# Patient Record
Sex: Female | Born: 1970
Health system: Southern US, Community
[De-identification: ages and names within clinical notes are randomized; demographics above are authoritative.]

## PROBLEM LIST (undated history)

## (undated) DIAGNOSIS — F329 Major depressive disorder, single episode, unspecified: Secondary | ICD-10-CM

## (undated) DIAGNOSIS — F32A Depression, unspecified: Secondary | ICD-10-CM

## (undated) DIAGNOSIS — Z516 Encounter for desensitization to allergens: Secondary | ICD-10-CM

## (undated) DIAGNOSIS — A6000 Herpesviral infection of urogenital system, unspecified: Secondary | ICD-10-CM

## (undated) DIAGNOSIS — R112 Nausea with vomiting, unspecified: Secondary | ICD-10-CM

## (undated) DIAGNOSIS — K219 Gastro-esophageal reflux disease without esophagitis: Secondary | ICD-10-CM

## (undated) DIAGNOSIS — Z9889 Other specified postprocedural states: Secondary | ICD-10-CM

## (undated) HISTORY — DX: Encounter for desensitization to allergens: Z51.6

## (undated) HISTORY — DX: Herpesviral infection of urogenital system, unspecified: A60.00

## (undated) HISTORY — DX: Gastro-esophageal reflux disease without esophagitis: K21.9

## (undated) HISTORY — DX: Major depressive disorder, single episode, unspecified: F32.9

## (undated) HISTORY — DX: Depression, unspecified: F32.A

---

## 2005-03-01 HISTORY — PX: ABDOMINAL HYSTERECTOMY: SHX81

## 2005-10-11 ENCOUNTER — Ambulatory Visit: Payer: Self-pay | Admitting: Obstetrics and Gynecology

## 2006-01-28 ENCOUNTER — Ambulatory Visit: Payer: Self-pay | Admitting: Obstetrics and Gynecology

## 2006-07-14 ENCOUNTER — Ambulatory Visit: Payer: Self-pay | Admitting: Obstetrics and Gynecology

## 2008-09-02 ENCOUNTER — Emergency Department: Payer: Self-pay | Admitting: Emergency Medicine

## 2009-12-31 ENCOUNTER — Ambulatory Visit: Payer: Self-pay

## 2011-02-03 ENCOUNTER — Ambulatory Visit: Payer: Self-pay

## 2014-01-11 ENCOUNTER — Ambulatory Visit: Payer: Self-pay | Admitting: Urgent Care

## 2014-01-18 ENCOUNTER — Ambulatory Visit: Payer: Self-pay | Admitting: Urgent Care

## 2014-07-25 ENCOUNTER — Encounter: Payer: Self-pay | Admitting: Family Medicine

## 2014-07-25 ENCOUNTER — Ambulatory Visit (INDEPENDENT_AMBULATORY_CARE_PROVIDER_SITE_OTHER): Payer: BLUE CROSS/BLUE SHIELD | Admitting: Family Medicine

## 2014-07-25 VITALS — BP 122/78 | Ht 65.0 in | Wt 160.0 lb

## 2014-07-25 DIAGNOSIS — F329 Major depressive disorder, single episode, unspecified: Secondary | ICD-10-CM

## 2014-07-25 DIAGNOSIS — K2100 Gastro-esophageal reflux disease with esophagitis, without bleeding: Secondary | ICD-10-CM

## 2014-07-25 DIAGNOSIS — F32A Depression, unspecified: Secondary | ICD-10-CM

## 2014-07-25 DIAGNOSIS — J328 Other chronic sinusitis: Secondary | ICD-10-CM | POA: Diagnosis not present

## 2014-07-25 DIAGNOSIS — J301 Allergic rhinitis due to pollen: Secondary | ICD-10-CM | POA: Diagnosis not present

## 2014-07-25 MED ORDER — DEXLANSOPRAZOLE 60 MG PO CPDR: 60.0000 mg | DELAYED_RELEASE_CAPSULE | Freq: Every day | ORAL | Status: DC

## 2014-07-25 NOTE — Progress Notes (Signed)
   Subjective:    Patient ID: Daisy Soto, female    DOB: [birth year], 44 y.o.   MRN: [id number]  HPI pt arrives today as a new pt. Pt's husband comes here Daisy Soto ( Bud).   Pt wants to discuss changing nexium back to dexilant. Upper endo revealed gastritis, pt was placed initially on nexium and then. GB scan was fine.  swiutched to dexilant and it helped, and pt not wanting to do an endoxcopy Would like a 90 day supply.   Pt got sick in November with sinus infection.  Has not been back to normal since. Feels like there is fluid in ears all the time.took two rounds of abx.  Saw an ent, cont to have popping sens in the ears.  Ongoing  Saw ENT.  Qnasal Prescribed prednisone and  spray. He wants to do allergy testing and a CT scan. Pt would like a second opinion.    rhinocort and nasocort taking. Checked on the price of the Qnasl. Very expensive. Patient under care from psychiatrist for depression and insomnia related to stress from divorce   Review of Systems No current abdominal pain no change in bowel habits no blood in stool    Objective:   Physical Exam  Alert no acute distress. Vitals stable. HEENT moderate nasal congestion. Left TM retracted. Pharynx normal neck supple no adenopathy lungs clear. Heart regular rhythm abdominal exam benign      Assessment & Plan:  Impression 1 reflux with history of gastritis #2 chronic sinus infection with middle ear effusion/pressure changes #3 depression with insomnia plan switch to dexilant per patient request. Patient to maintain taking care with psychiatrist. Long discussion held regarding ENT's plans. I think it is reasonable for them to do a CT to establish the level of sinus and middle ear involvement. Also go back sterilely nasal spray over-the-counter. 30 minutes spent most in discussion WSL

## 2014-07-26 ENCOUNTER — Other Ambulatory Visit: Payer: Self-pay | Admitting: Unknown Physician Specialty

## 2014-07-26 DIAGNOSIS — J329 Chronic sinusitis, unspecified: Secondary | ICD-10-CM

## 2014-07-30 ENCOUNTER — Other Ambulatory Visit: Payer: Self-pay | Admitting: Unknown Physician Specialty

## 2014-07-30 DIAGNOSIS — F32A Depression, unspecified: Secondary | ICD-10-CM | POA: Insufficient documentation

## 2014-07-30 DIAGNOSIS — F329 Major depressive disorder, single episode, unspecified: Secondary | ICD-10-CM | POA: Insufficient documentation

## 2014-07-30 DIAGNOSIS — J309 Allergic rhinitis, unspecified: Secondary | ICD-10-CM | POA: Insufficient documentation

## 2014-07-30 DIAGNOSIS — H903 Sensorineural hearing loss, bilateral: Secondary | ICD-10-CM

## 2014-07-30 DIAGNOSIS — K219 Gastro-esophageal reflux disease without esophagitis: Secondary | ICD-10-CM | POA: Insufficient documentation

## 2014-07-30 DIAGNOSIS — J329 Chronic sinusitis, unspecified: Secondary | ICD-10-CM | POA: Insufficient documentation

## 2014-08-01 ENCOUNTER — Ambulatory Visit: Admission: RE | Admit: 2014-08-01 | Payer: Self-pay | Source: Ambulatory Visit

## 2014-08-01 ENCOUNTER — Ambulatory Visit: Payer: BLUE CROSS/BLUE SHIELD

## 2014-12-02 ENCOUNTER — Encounter: Payer: Self-pay | Admitting: Family Medicine

## 2014-12-02 ENCOUNTER — Ambulatory Visit (INDEPENDENT_AMBULATORY_CARE_PROVIDER_SITE_OTHER): Payer: BLUE CROSS/BLUE SHIELD | Admitting: Family Medicine

## 2014-12-02 VITALS — BP 102/60 | Ht 65.0 in | Wt 165.0 lb

## 2014-12-02 DIAGNOSIS — L659 Nonscarring hair loss, unspecified: Secondary | ICD-10-CM | POA: Diagnosis not present

## 2014-12-02 DIAGNOSIS — R635 Abnormal weight gain: Secondary | ICD-10-CM

## 2014-12-02 DIAGNOSIS — R5383 Other fatigue: Secondary | ICD-10-CM | POA: Diagnosis not present

## 2014-12-02 DIAGNOSIS — Z23 Encounter for immunization: Secondary | ICD-10-CM | POA: Diagnosis not present

## 2014-12-02 NOTE — Progress Notes (Signed)
   Subjective:    Patient ID: Daisy Soto, female    DOB: 03-Dec-1970, 44 y.o.   MRN: [id number]  HPI Patient is here today because she has been experiencing hair loss, fatigue and weight gain.   Patient wants to have her thyroid levels checked to see if she has a thyroid issue. This has been going on since July 2016.   Patient has no other concerns at this time.   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  traz helpful, but question daytime drowsiness  No fam hx of thyr troule s            Review of Systems Some headache no GI symptoms no rash no GU symptoms no weight loss something    Objective:   Physical Exam  Alert mild malaise. Vital stable HEENT scalp slightly thin here no obvious alopecia pharynx normal neck supple. Lungs clear. Heart regular in rhythm.      Assessment & Plan:  Impression 1 fatigue discussed #2 excess hair loss #3 thyroid concerns. #4 increased stress both work and home #5 status post hysterectomy sometimes symptoms potentially consistent with low estrogenic plan appropriate blood work. Diet discussed exercise discussed. If low thyroid patient would like to see Dr. Francoise Schaumann easily 25 minutes spent most in discussion WSL

## 2014-12-05 LAB — LIPID PANEL
CHOL/HDL RATIO: 3.7 ratio (ref 0.0–4.4)
CHOLESTEROL TOTAL: 193 mg/dL (ref 100–199)
HDL: 52 mg/dL (ref >39)
LDL CALC: 100 mg/dL — AB (ref 0–99)
TRIGLYCERIDES: 205 mg/dL — AB (ref 0–149)
VLDL CHOLESTEROL CAL: 41 mg/dL — AB (ref 5–40)

## 2014-12-05 LAB — BASIC METABOLIC PANEL
BUN/Creatinine Ratio: 16 (ref 9–23)
BUN: 11 mg/dL (ref 6–24)
CALCIUM: 9.3 mg/dL (ref 8.7–10.2)
CO2: 25 mmol/L (ref 18–29)
Chloride: 101 mmol/L (ref 97–108)
Creatinine, Ser: 0.68 mg/dL (ref 0.57–1.00)
GFR calc Af Amer: 122 mL/min/{1.73_m2} (ref >59)
GFR calc non Af Amer: 106 mL/min/{1.73_m2} (ref >59)
GLUCOSE: 95 mg/dL (ref 65–99)
POTASSIUM: 4 mmol/L (ref 3.5–5.2)
SODIUM: 139 mmol/L (ref 134–144)

## 2014-12-05 LAB — FOLLICLE STIMULATING HORMONE: FSH: 2.2 m[IU]/mL

## 2014-12-05 LAB — HEPATIC FUNCTION PANEL
ALK PHOS: 80 IU/L (ref 39–117)
ALT: 12 IU/L (ref 0–32)
AST: 13 IU/L (ref 0–40)
Albumin: 4.1 g/dL (ref 3.5–5.5)
Bilirubin Total: 0.3 mg/dL (ref 0.0–1.2)
Bilirubin, Direct: 0.06 mg/dL (ref 0.00–0.40)
TOTAL PROTEIN: 6.6 g/dL (ref 6.0–8.5)

## 2014-12-05 LAB — CBC WITH DIFFERENTIAL/PLATELET
BASOS ABS: 0 10*3/uL (ref 0.0–0.2)
Basos: 0 %
EOS (ABSOLUTE): 0.1 10*3/uL (ref 0.0–0.4)
EOS: 1 %
HEMATOCRIT: 38.8 % (ref 34.0–46.6)
HEMOGLOBIN: 13.2 g/dL (ref 11.1–15.9)
IMMATURE GRANS (ABS): 0 10*3/uL (ref 0.0–0.1)
Immature Granulocytes: 0 %
LYMPHS ABS: 1 10*3/uL (ref 0.7–3.1)
LYMPHS: 22 %
MCH: 31.3 pg (ref 26.6–33.0)
MCHC: 34 g/dL (ref 31.5–35.7)
MCV: 92 fL (ref 79–97)
MONOCYTES: 8 %
Monocytes Absolute: 0.3 10*3/uL (ref 0.1–0.9)
NEUTROS ABS: 3.1 10*3/uL (ref 1.4–7.0)
Neutrophils: 69 %
Platelets: 209 10*3/uL (ref 150–379)
RBC: 4.22 x10E6/uL (ref 3.77–5.28)
RDW: 13.7 % (ref 12.3–15.4)
WBC: 4.5 10*3/uL (ref 3.4–10.8)

## 2014-12-05 LAB — TSH: TSH: 1.66 u[IU]/mL (ref 0.450–4.500)

## 2014-12-05 LAB — LUTEINIZING HORMONE: LH: 11.1 m[IU]/mL

## 2014-12-05 LAB — T4: T4 TOTAL: 6 ug/dL (ref 4.5–12.0)

## 2014-12-08 ENCOUNTER — Encounter: Payer: Self-pay | Admitting: Family Medicine

## 2014-12-16 ENCOUNTER — Telehealth: Payer: Self-pay | Admitting: Gastroenterology

## 2014-12-16 NOTE — Telephone Encounter (Signed)
Patient has called and would like to schedule an appointment with Dr Allen Norris. She states that she was last seen about a year ago and was prescribed dexilant. She states that she is having more severe flare ups and was advised that if it got worse that an EGD may need to be performed. Please contact patient at number confirmed in chart.

## 2014-12-16 NOTE — Telephone Encounter (Signed)
Please review, Possibly set her up for  Egd? without office appt?

## 2014-12-18 NOTE — Telephone Encounter (Signed)
LVM for pt to return my call to schedule EGD. Pt just needs a date and information.

## 2014-12-19 NOTE — Telephone Encounter (Signed)
Patient called and we have set her for her EGD

## 2014-12-25 NOTE — Telephone Encounter (Signed)
LVM for pt to return my call to see if she has decided on a date for her EGD.

## 2015-06-16 ENCOUNTER — Telehealth: Payer: Self-pay | Admitting: Gastroenterology

## 2015-06-16 NOTE — Telephone Encounter (Signed)
Daisy Soto, this is an established patient of [name]. She was last seen in 2015. She is again having problems with GERD and acid reflux - epigastric pain, severe burning - even with Dexilant that is being prescribed by her PCP.  I've made patient an appointment for May 16th at 3:30, if you are able to make a more urgent appointment please let her know. She was unable to do the 4:15 this Thursday 4/20 because of a deadline at work but said she could do any other day/time. Thanks!

## 2015-06-18 DIAGNOSIS — F4322 Adjustment disorder with anxiety: Secondary | ICD-10-CM | POA: Diagnosis not present

## 2015-06-18 NOTE — Telephone Encounter (Signed)
Called pt and advise her I have her on our wait list and will call if something opens up sooner.

## 2015-07-04 DIAGNOSIS — Z1231 Encounter for screening mammogram for malignant neoplasm of breast: Secondary | ICD-10-CM | POA: Diagnosis not present

## 2015-07-04 DIAGNOSIS — Z01419 Encounter for gynecological examination (general) (routine) without abnormal findings: Secondary | ICD-10-CM | POA: Diagnosis not present

## 2015-07-04 DIAGNOSIS — Z8742 Personal history of other diseases of the female genital tract: Secondary | ICD-10-CM | POA: Diagnosis not present

## 2015-07-04 DIAGNOSIS — Z1151 Encounter for screening for human papillomavirus (HPV): Secondary | ICD-10-CM | POA: Diagnosis not present

## 2015-07-04 DIAGNOSIS — A6004 Herpesviral vulvovaginitis: Secondary | ICD-10-CM | POA: Diagnosis not present

## 2015-07-04 DIAGNOSIS — Z124 Encounter for screening for malignant neoplasm of cervix: Secondary | ICD-10-CM | POA: Diagnosis not present

## 2015-07-08 DIAGNOSIS — F411 Generalized anxiety disorder: Secondary | ICD-10-CM | POA: Diagnosis not present

## 2015-07-08 DIAGNOSIS — F330 Major depressive disorder, recurrent, mild: Secondary | ICD-10-CM | POA: Diagnosis not present

## 2015-07-15 ENCOUNTER — Ambulatory Visit (INDEPENDENT_AMBULATORY_CARE_PROVIDER_SITE_OTHER): Payer: BLUE CROSS/BLUE SHIELD | Admitting: Gastroenterology

## 2015-07-15 ENCOUNTER — Encounter: Payer: Self-pay | Admitting: Gastroenterology

## 2015-07-15 ENCOUNTER — Other Ambulatory Visit: Payer: Self-pay

## 2015-07-15 VITALS — BP 99/57 | HR 87 | Temp 98.5°F | Ht 65.0 in | Wt 157.0 lb

## 2015-07-15 DIAGNOSIS — K219 Gastro-esophageal reflux disease without esophagitis: Secondary | ICD-10-CM | POA: Diagnosis not present

## 2015-07-15 MED ORDER — DEXLANSOPRAZOLE 60 MG PO CPDR: 60.0000 mg | DELAYED_RELEASE_CAPSULE | Freq: Every day | ORAL | Status: DC

## 2015-07-15 NOTE — Progress Notes (Signed)
   Primary Care Physician: Mickie Hillier, MD  Primary Gastroenterologist:  Dr. Lucilla Lame  Chief Complaint  Patient presents with  . Gastroesophageal Reflux    HPI: Daisy Soto is a 45 y.o. female here for follow-up of heartburn.The patient reports that last week she had breakthrough heartburn. The patient states that she had not stopped taking her Dexilant but she was under a lot more stress. The patient also reports that she had some bright red blood her rectum. She reports that this happens only when she has constipation. The patient has alternating diarrhea and constipation quite frequently. There is no report of any unexplained weight loss. The patient had a colonoscopy a few years ago.  Current Outpatient Prescriptions  Medication Sig Dispense Refill  . FLUoxetine (PROZAC) 40 MG capsule Take 40 mg by mouth daily.    . fluticasone (FLONASE) 50 MCG/ACT nasal spray     . traZODone (DESYREL) 50 MG tablet 50 mg. Takes one to two qhs prn  0  . acyclovir (ZOVIRAX) 400 MG tablet Take 400 mg by mouth 2 (two) times daily.  3  . dexlansoprazole (DEXILANT) 60 MG capsule Take 1 capsule (60 mg total) by mouth daily. 90 capsule 3   No current facility-administered medications for this visit.    Allergies as of 07/15/2015 - Review Complete 07/15/2015  Allergen Reaction Noted  . Biaxin [clarithromycin]  07/25/2014    ROS:  General: Negative for anorexia, weight loss, fever, chills, fatigue, weakness. ENT: Negative for hoarseness, difficulty swallowing , nasal congestion. CV: Negative for chest pain, angina, palpitations, dyspnea on exertion, peripheral edema.  Respiratory: Negative for dyspnea at rest, dyspnea on exertion, cough, sputum, wheezing.  GI: See history of present illness. GU:  Negative for dysuria, hematuria, urinary incontinence, urinary frequency, nocturnal urination.  Endo: Negative for unusual weight change.    Physical Examination:   BP 99/57 mmHg  Pulse 87   Temp(Src) 98.5 F (36.9 C) (Oral)  Ht 5\' 5"  (1.651 m)  Wt 157 lb (71.215 kg)  BMI 26.13 kg/m2  General: Well-nourished, well-developed in no acute distress.  Eyes: No icterus. Conjunctivae pink. Neuro: Alert and oriented x 3.  Grossly intact. Skin: Warm and dry, no jaundice.   Psych: Alert and cooperative, normal mood and affect.  Labs:    Imaging Studies: No results found.  Assessment and Plan:   Daisy Soto is a 45 y.o. y/o female who comes in today with a history of heartburn. The patient's heartburn has now resolved. The patient has been told to continue the Helper and to get some Tom's for whenever she has acid breakthrough. The patient has been explained the plan and agrees with it.   Note: This dictation was prepared with Dragon dictation along with smaller phrase technology. Any transcriptional errors that result from this process are unintentional.

## 2015-09-16 ENCOUNTER — Telehealth: Payer: Self-pay | Admitting: Gastroenterology

## 2015-09-16 ENCOUNTER — Other Ambulatory Visit: Payer: Self-pay

## 2015-09-16 DIAGNOSIS — K2100 Gastro-esophageal reflux disease with esophagitis, without bleeding: Secondary | ICD-10-CM

## 2015-09-16 MED ORDER — PANTOPRAZOLE SODIUM 40 MG PO TBEC: 40.0000 mg | DELAYED_RELEASE_TABLET | Freq: Every day | ORAL | Status: DC

## 2015-09-16 NOTE — Telephone Encounter (Signed)
Spoke with pt and she stated that she has been taking the Dexilant daily and has been adding Ranitidine in the afternoon for breakthrough reflux. She states she had taken Nexium in the past. Advised her we can change to Pantoprazole for 30 days. Is she doesn't have any improvement in her symptoms, then we should move forward with an EGD. Pt verbalized understanding and will call after the 30 days and advise.

## 2015-09-16 NOTE — Telephone Encounter (Signed)
Patient of Dr Allen Norris. She is on Dexilant, but it doesn't seem to be working anymore as she is having breakthrough flare ups with her GERD and acid reflux.  She would like to know if there is another medication that she could be switched to. Please call and advise.

## 2015-10-21 DIAGNOSIS — H10413 Chronic giant papillary conjunctivitis, bilateral: Secondary | ICD-10-CM | POA: Diagnosis not present

## 2015-10-29 DIAGNOSIS — F330 Major depressive disorder, recurrent, mild: Secondary | ICD-10-CM | POA: Diagnosis not present

## 2015-10-29 DIAGNOSIS — F411 Generalized anxiety disorder: Secondary | ICD-10-CM | POA: Diagnosis not present

## 2015-12-10 DIAGNOSIS — J309 Allergic rhinitis, unspecified: Secondary | ICD-10-CM | POA: Diagnosis not present

## 2015-12-10 DIAGNOSIS — J019 Acute sinusitis, unspecified: Secondary | ICD-10-CM | POA: Diagnosis not present

## 2016-01-14 DIAGNOSIS — Z23 Encounter for immunization: Secondary | ICD-10-CM | POA: Diagnosis not present

## 2016-03-02 DIAGNOSIS — F411 Generalized anxiety disorder: Secondary | ICD-10-CM | POA: Diagnosis not present

## 2016-03-02 DIAGNOSIS — F330 Major depressive disorder, recurrent, mild: Secondary | ICD-10-CM | POA: Diagnosis not present

## 2016-03-09 ENCOUNTER — Other Ambulatory Visit: Payer: Self-pay | Admitting: *Deleted

## 2016-03-09 ENCOUNTER — Telehealth: Payer: Self-pay | Admitting: Family Medicine

## 2016-03-09 DIAGNOSIS — K2100 Gastro-esophageal reflux disease with esophagitis, without bleeding: Secondary | ICD-10-CM

## 2016-03-09 NOTE — Telephone Encounter (Signed)
Would you like to refer or see pt?

## 2016-03-09 NOTE — Telephone Encounter (Signed)
Referral put in. Pt notified.  

## 2016-03-09 NOTE — Telephone Encounter (Signed)
Patient is wanting to start seeing a new gastro doctor, Dr. Watt Climes at [facility] in [place] for gastritis.  Patient has been seeing Dr. Roselyn Reef in [place] but is unhappy with her treatment because the medication isn't helping.  She wants to know if we can set her up with a referral or does she need to schedule an appointment with our office first?

## 2016-03-09 NOTE — Telephone Encounter (Signed)
Can do referal

## 2016-03-12 DIAGNOSIS — J301 Allergic rhinitis due to pollen: Secondary | ICD-10-CM | POA: Diagnosis not present

## 2016-03-15 DIAGNOSIS — J301 Allergic rhinitis due to pollen: Secondary | ICD-10-CM | POA: Diagnosis not present

## 2016-03-22 DIAGNOSIS — J301 Allergic rhinitis due to pollen: Secondary | ICD-10-CM | POA: Diagnosis not present

## 2016-03-25 DIAGNOSIS — J301 Allergic rhinitis due to pollen: Secondary | ICD-10-CM | POA: Diagnosis not present

## 2016-03-29 DIAGNOSIS — J301 Allergic rhinitis due to pollen: Secondary | ICD-10-CM | POA: Diagnosis not present

## 2016-03-31 DIAGNOSIS — J301 Allergic rhinitis due to pollen: Secondary | ICD-10-CM | POA: Diagnosis not present

## 2016-04-01 DIAGNOSIS — J301 Allergic rhinitis due to pollen: Secondary | ICD-10-CM | POA: Diagnosis not present

## 2016-04-05 DIAGNOSIS — J301 Allergic rhinitis due to pollen: Secondary | ICD-10-CM | POA: Diagnosis not present

## 2016-04-08 DIAGNOSIS — J301 Allergic rhinitis due to pollen: Secondary | ICD-10-CM | POA: Diagnosis not present

## 2016-04-12 DIAGNOSIS — J301 Allergic rhinitis due to pollen: Secondary | ICD-10-CM | POA: Diagnosis not present

## 2016-04-15 DIAGNOSIS — J301 Allergic rhinitis due to pollen: Secondary | ICD-10-CM | POA: Diagnosis not present

## 2016-04-19 ENCOUNTER — Other Ambulatory Visit (HOSPITAL_COMMUNITY): Payer: Self-pay | Admitting: Gastroenterology

## 2016-04-19 DIAGNOSIS — R1084 Generalized abdominal pain: Secondary | ICD-10-CM

## 2016-04-19 DIAGNOSIS — K2100 Gastro-esophageal reflux disease with esophagitis, without bleeding: Secondary | ICD-10-CM

## 2016-04-19 DIAGNOSIS — R1013 Epigastric pain: Secondary | ICD-10-CM | POA: Diagnosis not present

## 2016-04-22 DIAGNOSIS — J301 Allergic rhinitis due to pollen: Secondary | ICD-10-CM | POA: Diagnosis not present

## 2016-04-26 ENCOUNTER — Encounter (HOSPITAL_COMMUNITY): Payer: BLUE CROSS/BLUE SHIELD

## 2016-04-29 DIAGNOSIS — J301 Allergic rhinitis due to pollen: Secondary | ICD-10-CM | POA: Diagnosis not present

## 2016-05-06 DIAGNOSIS — J301 Allergic rhinitis due to pollen: Secondary | ICD-10-CM | POA: Diagnosis not present

## 2016-05-13 DIAGNOSIS — J301 Allergic rhinitis due to pollen: Secondary | ICD-10-CM | POA: Diagnosis not present

## 2016-05-20 DIAGNOSIS — J301 Allergic rhinitis due to pollen: Secondary | ICD-10-CM | POA: Diagnosis not present

## 2016-05-24 DIAGNOSIS — H10413 Chronic giant papillary conjunctivitis, bilateral: Secondary | ICD-10-CM | POA: Diagnosis not present

## 2016-05-26 DIAGNOSIS — J301 Allergic rhinitis due to pollen: Secondary | ICD-10-CM | POA: Diagnosis not present

## 2016-05-26 DIAGNOSIS — J0140 Acute pansinusitis, unspecified: Secondary | ICD-10-CM | POA: Diagnosis not present

## 2016-06-01 DIAGNOSIS — F330 Major depressive disorder, recurrent, mild: Secondary | ICD-10-CM | POA: Diagnosis not present

## 2016-06-01 DIAGNOSIS — F411 Generalized anxiety disorder: Secondary | ICD-10-CM | POA: Diagnosis not present

## 2016-06-03 DIAGNOSIS — J301 Allergic rhinitis due to pollen: Secondary | ICD-10-CM | POA: Diagnosis not present

## 2016-06-10 DIAGNOSIS — J301 Allergic rhinitis due to pollen: Secondary | ICD-10-CM | POA: Diagnosis not present

## 2016-06-17 DIAGNOSIS — J301 Allergic rhinitis due to pollen: Secondary | ICD-10-CM | POA: Diagnosis not present

## 2016-06-22 DIAGNOSIS — J301 Allergic rhinitis due to pollen: Secondary | ICD-10-CM | POA: Diagnosis not present

## 2016-06-24 DIAGNOSIS — J301 Allergic rhinitis due to pollen: Secondary | ICD-10-CM | POA: Diagnosis not present

## 2016-07-01 DIAGNOSIS — J301 Allergic rhinitis due to pollen: Secondary | ICD-10-CM | POA: Diagnosis not present

## 2016-07-01 DIAGNOSIS — F330 Major depressive disorder, recurrent, mild: Secondary | ICD-10-CM | POA: Diagnosis not present

## 2016-07-01 DIAGNOSIS — F411 Generalized anxiety disorder: Secondary | ICD-10-CM | POA: Diagnosis not present

## 2016-07-15 DIAGNOSIS — J301 Allergic rhinitis due to pollen: Secondary | ICD-10-CM | POA: Diagnosis not present

## 2016-07-28 DIAGNOSIS — R140 Abdominal distension (gaseous): Secondary | ICD-10-CM | POA: Diagnosis not present

## 2016-07-29 DIAGNOSIS — J301 Allergic rhinitis due to pollen: Secondary | ICD-10-CM | POA: Diagnosis not present

## 2016-08-03 DIAGNOSIS — F4320 Adjustment disorder, unspecified: Secondary | ICD-10-CM | POA: Diagnosis not present

## 2016-08-05 ENCOUNTER — Ambulatory Visit (INDEPENDENT_AMBULATORY_CARE_PROVIDER_SITE_OTHER): Payer: BLUE CROSS/BLUE SHIELD | Admitting: Obstetrics and Gynecology

## 2016-08-05 ENCOUNTER — Encounter: Payer: Self-pay | Admitting: Obstetrics and Gynecology

## 2016-08-05 VITALS — BP 118/76 | Ht 65.0 in | Wt 170.0 lb

## 2016-08-05 DIAGNOSIS — Z1239 Encounter for other screening for malignant neoplasm of breast: Secondary | ICD-10-CM

## 2016-08-05 DIAGNOSIS — Z1151 Encounter for screening for human papillomavirus (HPV): Secondary | ICD-10-CM | POA: Diagnosis not present

## 2016-08-05 DIAGNOSIS — Z1231 Encounter for screening mammogram for malignant neoplasm of breast: Secondary | ICD-10-CM

## 2016-08-05 DIAGNOSIS — Z01419 Encounter for gynecological examination (general) (routine) without abnormal findings: Secondary | ICD-10-CM | POA: Diagnosis not present

## 2016-08-05 DIAGNOSIS — Z124 Encounter for screening for malignant neoplasm of cervix: Secondary | ICD-10-CM | POA: Diagnosis not present

## 2016-08-05 DIAGNOSIS — Z713 Dietary counseling and surveillance: Secondary | ICD-10-CM | POA: Diagnosis not present

## 2016-08-05 DIAGNOSIS — Z8041 Family history of malignant neoplasm of ovary: Secondary | ICD-10-CM | POA: Diagnosis not present

## 2016-08-05 DIAGNOSIS — R87620 Atypical squamous cells of undetermined significance on cytologic smear of vagina (ASC-US): Secondary | ICD-10-CM | POA: Diagnosis not present

## 2016-08-05 DIAGNOSIS — J301 Allergic rhinitis due to pollen: Secondary | ICD-10-CM | POA: Diagnosis not present

## 2016-08-05 DIAGNOSIS — A6004 Herpesviral vulvovaginitis: Secondary | ICD-10-CM

## 2016-08-05 NOTE — Progress Notes (Signed)
Chief Complaint  Patient presents with  . Annual Exam     HPI:      [name] is a 46 y.o. No obstetric history on file. who LMP was No LMP recorded. Patient has had a hysterectomy., presents today for her annual examination.  Her menses are absent due to TAH. She does not have intermenstrual bleeding.  Sex activity: single partner, contraception - post menopausal status.  Last Pap: December 15, 2012  Results were: no abnormalities /neg HPV DNA  Hx of STDs: HSV, HPV. She takes acyclovir prn and will call for Rx RF prn.  Last mammogram: Jul 04, 2015  Results were: normal--routine follow-up in 12 months There is no FH of breast cancer. There is a FH of ovarian cancer in her MGM, genetic testing not done. Pt declined last yr.  The patient does do self-breast exams.  Tobacco use: The patient denies current or previous tobacco use. Alcohol use: social drinker Exercise: moderately active  She does get adequate calcium and Vitamin D in her diet.  She has not been able to lose wt, but is eating healthier and walking for exercise. She had normal TSH with GI recently. She is frustrated. She is up 16# from last yr on our scales. She has been under increased stressed with a child.   Past Medical History:  Diagnosis Date  . Depression   . GERD (gastroesophageal reflux disease)   . Herpes genitalia     Past Surgical History:  Procedure Laterality Date  . ABDOMINAL HYSTERECTOMY  2007  . CESAREAN SECTION  4- 2000    Family History  Problem Relation Age of Onset  . Stroke Mother   . Hypertension Mother   . Heart disease Father   . Hypertension Father   . Ovarian cancer Maternal Grandmother 80    Social History   Social History  . Marital status: Married    Spouse name: N/A  . Number of children: N/A  . Years of education: N/A   Occupational History  . Not on file.   Social History Main Topics  . Smoking status: Never Smoker  . Smokeless tobacco: Never Used  .  Alcohol use 0.0 oz/week     Comment: rare consumption  . Drug use: No  . Sexual activity: Yes    Birth control/ protection: Surgical   Other Topics Concern  . Not on file   Social History Narrative  . No narrative on file     Current Outpatient Prescriptions:  .  acyclovir (ZOVIRAX) 400 MG tablet, Take 400 mg by mouth 2 (two) times daily., Disp: , Rfl: 3 .  FLUoxetine (PROZAC) 40 MG capsule, Take 40 mg by mouth daily., Disp: , Rfl:  .  fluticasone (FLONASE) 50 MCG/ACT nasal spray, , Disp: , Rfl:  .  dexlansoprazole (DEXILANT) 60 MG capsule, Take 1 capsule (60 mg total) by mouth daily. (Patient not taking: Reported on 08/05/2016), Disp: 90 capsule, Rfl: 3 .  pantoprazole (PROTONIX) 40 MG tablet, Take 1 tablet (40 mg total) by mouth daily. (Patient not taking: Reported on 08/05/2016), Disp: 30 tablet, Rfl: 1 .  traZODone (DESYREL) 50 MG tablet, 50 mg. Takes one to two qhs prn, Disp: , Rfl: 0  ROS:  Review of Systems  Constitutional: Negative for fatigue, fever and unexpected weight change.  Respiratory: Negative for cough, shortness of breath and wheezing.   Cardiovascular: Negative for chest pain, palpitations and leg swelling.  Gastrointestinal: Negative for blood in stool,  constipation, diarrhea, nausea and vomiting.  Endocrine: Negative for cold intolerance, heat intolerance and polyuria.  Genitourinary: Negative for dyspareunia, dysuria, flank pain, frequency, genital sores, hematuria, menstrual problem, pelvic pain, urgency, vaginal bleeding, vaginal discharge and vaginal pain.  Musculoskeletal: Negative for back pain, joint swelling and myalgias.  Skin: Negative for rash.  Neurological: Negative for dizziness, syncope, light-headedness, numbness and headaches.  Hematological: Negative for adenopathy.  Psychiatric/Behavioral: Negative for agitation, confusion, sleep disturbance and suicidal ideas. The patient is not nervous/anxious.      Objective: BP 118/76   Ht 5\' 5"  (1.651  m)   Wt 170 lb (77.1 kg)   BMI 28.29 kg/m    Physical Exam  Constitutional: She is oriented to person, place, and time. She appears well-developed and well-nourished.  Genitourinary: Vagina normal. There is no rash or tenderness on the right labia. There is no rash or tenderness on the left labia. No erythema or tenderness in the vagina. No vaginal discharge found. Right adnexum does not display mass and does not display tenderness. Left adnexum does not display mass and does not display tenderness.  Genitourinary Comments: UTERUS/CX SURG REM  Neck: Normal range of motion. No thyromegaly present.  Cardiovascular: Normal rate, regular rhythm and normal heart sounds.   No murmur heard. Pulmonary/Chest: Effort normal and breath sounds normal. Right breast exhibits no mass, no nipple discharge, no skin change and no tenderness. Left breast exhibits no mass, no nipple discharge, no skin change and no tenderness.  Abdominal: Soft. There is no tenderness. There is no guarding.  Musculoskeletal: Normal range of motion.  Neurological: She is alert and oriented to person, place, and time. No cranial nerve deficit.  Psychiatric: She has a normal mood and affect. Her behavior is normal.  Vitals reviewed.   Assessment/Plan: Encounter for annual routine gynecological examination  Cervical cancer screening - Plan: IGP, Aptima HPV  Screening for HPV (human papillomavirus) - Plan: IGP, Aptima HPV  Screening for breast cancer - Pt to sched mammo. - Plan: MM DIGITAL SCREENING BILATERAL  Weight loss counseling, encounter for - [employer]/<40 g carbs/increase exercise intensity/stress mgmt. F/u prn.  Herpes simplex vulvovaginitis - Will call for acyclovir RF prn.  Family history of ovarian cancer - My Risk testing offered again and pt declines. No further screening recommendations since MGM.             GYN counsel mammography screening, adequate intake of calcium and vitamin D, diet and  exercise     F/U  Return in about 1 year (around 08/05/2017).  Takeisha Cianci B. Chenay Nesmith, PA-C 08/05/2016 3:42 PM

## 2016-08-10 LAB — IGP, APTIMA HPV
HPV Aptima: NEGATIVE
PAP SMEAR COMMENT: 0

## 2016-08-12 ENCOUNTER — Encounter: Payer: Self-pay | Admitting: Family Medicine

## 2016-08-12 ENCOUNTER — Ambulatory Visit (INDEPENDENT_AMBULATORY_CARE_PROVIDER_SITE_OTHER): Payer: BLUE CROSS/BLUE SHIELD | Admitting: Family Medicine

## 2016-08-12 VITALS — BP 112/78 | Temp 98.6°F | Ht 65.0 in | Wt 167.0 lb

## 2016-08-12 DIAGNOSIS — J209 Acute bronchitis, unspecified: Secondary | ICD-10-CM

## 2016-08-12 DIAGNOSIS — J329 Chronic sinusitis, unspecified: Secondary | ICD-10-CM | POA: Diagnosis not present

## 2016-08-12 DIAGNOSIS — J310 Chronic rhinitis: Secondary | ICD-10-CM

## 2016-08-12 MED ORDER — CEFPROZIL 500 MG PO TABS: 500.0000 mg | ORAL_TABLET | Freq: Two times a day (BID) | ORAL | 0 refills | Status: DC

## 2016-08-12 NOTE — Progress Notes (Signed)
   Subjective:    Patient ID: Daisy Soto, female    DOB: 11-May-1970, 46 y.o.   MRN: [id number]  Sinusitis  This is a new problem. Episode onset: 3 days. Associated symptoms include congestion, coughing and a sore throat. (Fever) Treatments tried: mucinex.    mon sore throat  Little cong  Then tue sore throt and feeling pooly  Felt  burnign into the chest  isusiing mucinex  No knon expoure  Pos prod cough no phlegm'   Had hx of bonchitis bad two yrs ago  Do not smoke   No wheezing hx  Allergies to dogs, on allergy shots,  Once per wk vaccines    Review of Systems  HENT: Positive for congestion and sore throat.   Respiratory: Positive for cough.        Objective:   Physical Exam Alert, mild malaise. Hydration good Vitals stable. frontal/ maxillary tenderness evident positive nasal congestion. pharynx normal neck supple  lungs clear/no crackles or wheezes. heart regular in rhythm        Assessment & Plan:  Impression rhinosinusitis likely post viral, discussed with patient. plan antibiotics prescribed. Questions answered. Symptomatic care discussed. warning signs discussed. WSL Plus element of bronchitis and laryngitis discussed

## 2016-08-18 DIAGNOSIS — J301 Allergic rhinitis due to pollen: Secondary | ICD-10-CM | POA: Diagnosis not present

## 2016-08-24 DIAGNOSIS — F4320 Adjustment disorder, unspecified: Secondary | ICD-10-CM | POA: Diagnosis not present

## 2016-08-25 DIAGNOSIS — J301 Allergic rhinitis due to pollen: Secondary | ICD-10-CM | POA: Diagnosis not present

## 2016-09-02 DIAGNOSIS — J301 Allergic rhinitis due to pollen: Secondary | ICD-10-CM | POA: Diagnosis not present

## 2016-09-03 DIAGNOSIS — J301 Allergic rhinitis due to pollen: Secondary | ICD-10-CM | POA: Diagnosis not present

## 2016-09-03 DIAGNOSIS — J342 Deviated nasal septum: Secondary | ICD-10-CM | POA: Diagnosis not present

## 2016-09-03 DIAGNOSIS — J329 Chronic sinusitis, unspecified: Secondary | ICD-10-CM | POA: Diagnosis not present

## 2016-09-08 ENCOUNTER — Encounter: Payer: Self-pay | Admitting: Family Medicine

## 2016-09-08 ENCOUNTER — Ambulatory Visit (INDEPENDENT_AMBULATORY_CARE_PROVIDER_SITE_OTHER): Payer: BLUE CROSS/BLUE SHIELD | Admitting: Family Medicine

## 2016-09-08 NOTE — Progress Notes (Unsigned)
   Subjective:    Patient ID: Daisy Soto, female    DOB: 1970-12-23, 46 y.o.   MRN: [id number]  HPI  Patient in today with c/o fatigue. Onset months ago.   States no other concerns this visit.  Review of Systems     Objective:   Physical Exam        Assessment & Plan:

## 2016-09-09 DIAGNOSIS — J301 Allergic rhinitis due to pollen: Secondary | ICD-10-CM | POA: Diagnosis not present

## 2016-09-14 DIAGNOSIS — J301 Allergic rhinitis due to pollen: Secondary | ICD-10-CM | POA: Diagnosis not present

## 2016-09-16 DIAGNOSIS — J301 Allergic rhinitis due to pollen: Secondary | ICD-10-CM | POA: Diagnosis not present

## 2016-09-17 DIAGNOSIS — J329 Chronic sinusitis, unspecified: Secondary | ICD-10-CM | POA: Diagnosis not present

## 2016-09-21 DIAGNOSIS — J329 Chronic sinusitis, unspecified: Secondary | ICD-10-CM | POA: Diagnosis not present

## 2016-09-21 DIAGNOSIS — J301 Allergic rhinitis due to pollen: Secondary | ICD-10-CM | POA: Diagnosis not present

## 2016-09-23 DIAGNOSIS — J301 Allergic rhinitis due to pollen: Secondary | ICD-10-CM | POA: Diagnosis not present

## 2016-09-29 DIAGNOSIS — J301 Allergic rhinitis due to pollen: Secondary | ICD-10-CM | POA: Diagnosis not present

## 2016-10-01 DIAGNOSIS — F330 Major depressive disorder, recurrent, mild: Secondary | ICD-10-CM | POA: Diagnosis not present

## 2016-10-01 DIAGNOSIS — F411 Generalized anxiety disorder: Secondary | ICD-10-CM | POA: Diagnosis not present

## 2016-10-11 DIAGNOSIS — F4320 Adjustment disorder, unspecified: Secondary | ICD-10-CM | POA: Diagnosis not present

## 2016-10-21 ENCOUNTER — Encounter
Admission: RE | Disposition: A | Discharge status: Home / Self Care | Payer: Self-pay | Source: Ambulatory Visit | Attending: Otolaryngology

## 2016-10-21 ENCOUNTER — Ambulatory Visit
Admission: RE | Admit: 2016-10-21 | Discharge: 2016-10-21 | Disposition: A | Discharge status: Home / Self Care | Payer: BLUE CROSS/BLUE SHIELD | Source: Ambulatory Visit | Attending: Otolaryngology | Admitting: Otolaryngology

## 2016-10-21 ENCOUNTER — Ambulatory Visit: Payer: BLUE CROSS/BLUE SHIELD | Admitting: Anesthesiology

## 2016-10-21 DIAGNOSIS — Z79899 Other long term (current) drug therapy: Secondary | ICD-10-CM | POA: Diagnosis not present

## 2016-10-21 DIAGNOSIS — J320 Chronic maxillary sinusitis: Secondary | ICD-10-CM | POA: Diagnosis not present

## 2016-10-21 DIAGNOSIS — D106 Benign neoplasm of nasopharynx: Secondary | ICD-10-CM | POA: Diagnosis not present

## 2016-10-21 DIAGNOSIS — D169 Benign neoplasm of bone and articular cartilage, unspecified: Secondary | ICD-10-CM | POA: Insufficient documentation

## 2016-10-21 DIAGNOSIS — J322 Chronic ethmoidal sinusitis: Secondary | ICD-10-CM | POA: Diagnosis not present

## 2016-10-21 DIAGNOSIS — J329 Chronic sinusitis, unspecified: Secondary | ICD-10-CM | POA: Diagnosis not present

## 2016-10-21 DIAGNOSIS — J321 Chronic frontal sinusitis: Secondary | ICD-10-CM | POA: Diagnosis not present

## 2016-10-21 DIAGNOSIS — K219 Gastro-esophageal reflux disease without esophagitis: Secondary | ICD-10-CM | POA: Diagnosis not present

## 2016-10-21 DIAGNOSIS — F329 Major depressive disorder, single episode, unspecified: Secondary | ICD-10-CM | POA: Insufficient documentation

## 2016-10-21 DIAGNOSIS — J343 Hypertrophy of nasal turbinates: Secondary | ICD-10-CM | POA: Diagnosis not present

## 2016-10-21 HISTORY — DX: Nausea with vomiting, unspecified: R11.2

## 2016-10-21 HISTORY — DX: Other specified postprocedural states: Z98.890

## 2016-10-21 HISTORY — PX: ETHMOIDECTOMY: SHX5197

## 2016-10-21 HISTORY — PX: IMAGE GUIDED SINUS SURGERY: SHX6570

## 2016-10-21 HISTORY — PX: MAXILLARY ANTROSTOMY: SHX2003

## 2016-10-21 SURGERY — SINUS SURGERY, WITH IMAGING GUIDANCE
Anesthesia: General | Laterality: Right

## 2016-10-21 MED ORDER — SCOPOLAMINE 1 MG/3DAYS TD PT72
1.0000 | MEDICATED_PATCH | Freq: Once | TRANSDERMAL | Status: DC
  Administered 2016-10-21: 1.5 mg via TRANSDERMAL

## 2016-10-21 MED ORDER — PROMETHAZINE HCL 25 MG/ML IJ SOLN: 6.2500 mg | INTRAMUSCULAR | Status: DC | PRN

## 2016-10-21 MED ORDER — OXYMETAZOLINE HCL 0.05 % NA SOLN
2.0000 | Freq: Once | NASAL | Status: AC
  Administered 2016-10-21: 2 via NASAL

## 2016-10-21 MED ORDER — LIDOCAINE-EPINEPHRINE 1 %-1:100000 IJ SOLN
INTRAMUSCULAR | Status: DC | PRN
  Administered 2016-10-21: 4.5 mL

## 2016-10-21 MED ORDER — PHENYLEPHRINE HCL 0.5 % NA SOLN
NASAL | Status: DC | PRN
  Administered 2016-10-21: 10 mL via NASAL

## 2016-10-21 MED ORDER — PROPOFOL 10 MG/ML IV BOLUS
INTRAVENOUS | Status: DC | PRN
  Administered 2016-10-21: 150 mg via INTRAVENOUS
  Administered 2016-10-21: 30 mg via INTRAVENOUS

## 2016-10-21 MED ORDER — METOCLOPRAMIDE HCL 5 MG/ML IJ SOLN
INTRAMUSCULAR | Status: DC | PRN
  Administered 2016-10-21: 10 mg via INTRAVENOUS

## 2016-10-21 MED ORDER — LIDOCAINE HCL (CARDIAC) 20 MG/ML IV SOLN
INTRAVENOUS | Status: DC | PRN
  Administered 2016-10-21: 20 mg via INTRAVENOUS
  Administered 2016-10-21: 50 mg via INTRAVENOUS

## 2016-10-21 MED ORDER — OXYCODONE HCL 5 MG/5ML PO SOLN: 5.0000 mg | Freq: Once | ORAL | Status: DC | PRN

## 2016-10-21 MED ORDER — PROMETHAZINE HCL 25 MG/ML IJ SOLN
INTRAMUSCULAR | Status: DC | PRN
  Administered 2016-10-21: 12.5 mg via INTRAVENOUS

## 2016-10-21 MED ORDER — MEPERIDINE HCL 25 MG/ML IJ SOLN: 6.2500 mg | INTRAMUSCULAR | Status: DC | PRN

## 2016-10-21 MED ORDER — FENTANYL CITRATE (PF) 100 MCG/2ML IJ SOLN: 25.0000 ug | INTRAMUSCULAR | Status: DC | PRN

## 2016-10-21 MED ORDER — ACETAMINOPHEN 10 MG/ML IV SOLN
1000.0000 mg | Freq: Once | INTRAVENOUS | Status: AC
  Administered 2016-10-21: 1000 mg via INTRAVENOUS

## 2016-10-21 MED ORDER — MIDAZOLAM HCL 5 MG/5ML IJ SOLN
INTRAMUSCULAR | Status: DC | PRN
  Administered 2016-10-21: 2 mg via INTRAVENOUS

## 2016-10-21 MED ORDER — OXYCODONE HCL 5 MG PO TABS: 5.0000 mg | ORAL_TABLET | Freq: Once | ORAL | Status: DC | PRN

## 2016-10-21 MED ORDER — DEXAMETHASONE SODIUM PHOSPHATE 4 MG/ML IJ SOLN
INTRAMUSCULAR | Status: DC | PRN
  Administered 2016-10-21: 12 mg via INTRAVENOUS

## 2016-10-21 MED ORDER — LACTATED RINGERS IV SOLN
10.0000 mL/h | INTRAVENOUS | Status: DC
  Administered 2016-10-21: 10 mL/h via INTRAVENOUS

## 2016-10-21 MED ORDER — SUCCINYLCHOLINE CHLORIDE 20 MG/ML IJ SOLN
INTRAMUSCULAR | Status: DC | PRN
  Administered 2016-10-21: 60 mg via INTRAVENOUS

## 2016-10-21 MED ORDER — ONDANSETRON HCL 4 MG/2ML IJ SOLN
INTRAMUSCULAR | Status: DC | PRN
  Administered 2016-10-21 (×2): 4 mg via INTRAVENOUS

## 2016-10-21 MED ORDER — CEFAZOLIN SODIUM-DEXTROSE 2-4 GM/100ML-% IV SOLN
2.0000 g | Freq: Once | INTRAVENOUS | Status: AC
  Administered 2016-10-21: 2 g via INTRAVENOUS

## 2016-10-21 SURGICAL SUPPLY — 32 items
BALLOON SINUPLASTY SYSTEM (BALLOONS) IMPLANT
BATTERY INSTRU NAVIGATION (MISCELLANEOUS) ×16 IMPLANT
CANISTER SUCT 1200ML W/VALVE (MISCELLANEOUS) ×4 IMPLANT
CATH IV 18X1 1/4 SAFELET (CATHETERS) ×4 IMPLANT
COAGULATOR SUCT 8FR VV (MISCELLANEOUS) ×4 IMPLANT
CUP MEDICINE 2OZ PLAST GRAD ST (MISCELLANEOUS) ×4 IMPLANT
DEVICE INFLATION SEID (MISCELLANEOUS) IMPLANT
DRAPE HEAD BAR (DRAPES) ×4 IMPLANT
GLOVE BIO SURGEON STRL SZ7 (GLOVE) ×8 IMPLANT
GLOVE PI ULTRA LF STRL 7.5 (GLOVE) ×6 IMPLANT
GLOVE PI ULTRA NON LATEX 7.5 (GLOVE) ×2
IV CATH 18X1 1/4 SAFELET (CATHETERS) ×3
IV NS 500ML (IV SOLUTION) ×1
IV NS 500ML BAXH (IV SOLUTION) ×3 IMPLANT
KIT ROOM TURNOVER OR (KITS) ×4 IMPLANT
NEEDLE ANESTHESIA  27G X 3.5 (NEEDLE) ×1
NEEDLE ANESTHESIA 27G X 3.5 (NEEDLE) ×3 IMPLANT
NEEDLE SPNL 25GX3.5 QUINCKE BL (NEEDLE) ×4 IMPLANT
NS IRRIG 500ML POUR BTL (IV SOLUTION) ×4 IMPLANT
PACK DRAPE NASAL/ENT (PACKS) ×4 IMPLANT
PACKING NASAL EPIS 4X2.4 XEROG (MISCELLANEOUS) ×8 IMPLANT
PAD GROUND ADULT SPLIT (MISCELLANEOUS) ×4 IMPLANT
PATTIES SURGICAL .5 X3 (DISPOSABLE) ×4 IMPLANT
SHAVER DIEGO BLD STD TYPE A (BLADE) IMPLANT
SOL ANTI-FOG 6CC FOG-OUT (MISCELLANEOUS) ×3 IMPLANT
SOL FOG-OUT ANTI-FOG 6CC (MISCELLANEOUS) ×1
STRAP BODY AND KNEE 60X3 (MISCELLANEOUS) ×4 IMPLANT
SYR 3ML LL SCALE MARK (SYRINGE) ×4 IMPLANT
TOWEL OR 17X26 4PK STRL BLUE (TOWEL DISPOSABLE) ×4 IMPLANT
TRACKER CRANIALMASK (MASK) ×4 IMPLANT
TUBING DECLOG MULTIDEBRIDER (TUBING) IMPLANT
WATER STERILE IRR 250ML POUR (IV SOLUTION) ×4 IMPLANT

## 2016-10-21 NOTE — Anesthesia Procedure Notes (Signed)
Procedure Name: Intubation Date/Time: 10/21/2016 10:17 AM Performed by: Londell Moh Pre-anesthesia Checklist: Patient identified, Emergency Drugs available, Suction available, Patient being monitored and Timeout performed Patient Re-evaluated:Patient Re-evaluated prior to induction Oxygen Delivery Method: Circle system utilized Preoxygenation: Pre-oxygenation with 100% oxygen Induction Type: IV induction Ventilation: Mask ventilation without difficulty Laryngoscope Size: Mac and 3 Grade View: Grade I Tube type: Oral Rae Tube size: 7.0 mm Number of attempts: 1 Placement Confirmation: ETT inserted through vocal cords under direct vision,  positive ETCO2 and breath sounds checked- equal and bilateral Tube secured with: Tape Dental Injury: Teeth and Oropharynx as per pre-operative assessment

## 2016-10-21 NOTE — H&P (Signed)
  H&P has been reviewed and patient reevaluated. and no changes necessary. To be downloaded later.

## 2016-10-21 NOTE — Discharge Instructions (Signed)
[facility] °[facility] °ENDOSCOPIC SINUS SURGERY ° [facility] ° °What is Functional Endoscopic Sinus Surgery? ° The Surgery involves making the natural openings of the sinuses larger by removing the bony partitions that separate the sinuses from the nasal cavity.  The natural sinus lining is preserved as much as possible to allow the sinuses to resume normal function after the surgery.  In some patients nasal polyps (excessively swollen lining of the sinuses) may be removed to relieve obstruction of the sinus openings.  The surgery is performed through the nose using lighted scopes, which eliminates the need for incisions on the face.  A septoplasty is a different procedure which is sometimes performed with sinus surgery.  It involves straightening the boy partition that separates the two sides of your nose.  A crooked or deviated septum may need repair if is obstructing the sinuses or nasal airflow.  Turbinate reduction is also often performed during sinus surgery.  The turbinates are bony proturberances from the side walls of the nose which swell and can obstruct the nose in patients with sinus and allergy problems.  Their size can be surgically reduced to help relieve nasal obstruction. ° °What Can Sinus Surgery Do For Me? ° Sinus surgery can reduce the frequency of sinus infections requiring antibiotic treatment.  This can provide improvement in nasal congestion, post-nasal drainage, facial pressure and nasal obstruction.  Surgery will NOT prevent you from ever having an infection again, so it usually only for patients who get infections 4 or more times yearly requiring antibiotics, or for infections that do not clear with antibiotics.  It will not cure nasal allergies, so patients with allergies may still require medication to treat their allergies after surgery. Surgery may improve headaches related to sinusitis, however, some people will continue to  require medication to control sinus headaches related to allergies.  Surgery will do nothing for other forms of headache (migraine, tension or cluster). ° °What Are the Risks of Endoscopic Sinus Surgery? ° Current techniques allow surgery to be performed safely with little risk, however, there are rare complications that patients should be aware of.  Because the sinuses are located around the eyes, there is risk of eye injury, including blindness, though again, this would be quite rare. This is usually a result of bleeding behind the eye during surgery, which puts the vision oat risk, though there are treatments to protect the vision and prevent permanent disrupted by surgery causing a leak of the spinal fluid that surrounds the brain.  More serious complications would include bleeding inside the brain cavity or damage to the brain.  Again, all of these complications are uncommon, and spinal fluid leaks can be safely managed surgically if they occur.  The most common complication of sinus surgery is bleeding from the nose, which may require packing or cauterization of the nose.  Continued sinus have polyps may experience recurrence of the polyps requiring revision surgery.  Alterations of sense of smell or injury to the tear ducts are also rare complications.  ° °What is the Surgery Like, and what is the Recovery? ° The Surgery usually takes a couple of hours to perform, and is usually performed under a general anesthetic (completely asleep).  Patients are usually discharged home after a couple of hours.  Sometimes during surgery it is necessary to pack the nose to control bleeding, and the packing is left in place for 24 - 48 hours, and removed by your surgeon.    If a septoplasty was performed during the procedure, there is often a splint placed which must be removed after 5-7 days.   °Discomfort: Pain is usually mild to moderate, and can be controlled by prescription pain medication or acetaminophen (Tylenol).   Aspirin, Ibuprofen (Advil, Motrin), or Naprosyn (Aleve) should be avoided, as they can cause increased bleeding.  Most patients feel sinus pressure like they have a bad head cold for several days.  Sleeping with your head elevated can help reduce swelling and facial pressure, as can ice packs over the face.  A humidifier may be helpful to keep the mucous and blood from drying in the nose.  ° °Diet: There are no specific diet restrictions, however, you should generally start with clear liquids and a light diet of bland foods because the anesthetic can cause some nausea.  Advance your diet depending on how your stomach feels.  Taking your pain medication with food will often help reduce stomach upset which pain medications can cause. ° °Nasal Saline Irrigation: It is important to remove blood clots and dried mucous from the nose as it is healing.  This is done by having you irrigate the nose at least 3 - 4 times daily with a salt water solution.  We recommend using NeilMed Sinus Rinse (available at the drug store).  Fill the squeeze bottle with the solution, bend over a sink, and insert the tip of the squeeze bottle into the nose ½ of an inch.  Point the tip of the squeeze bottle towards the inside corner of the eye on the same side your irrigating.  Squeeze the bottle and gently irrigate the nose.  If you bend forward as you do this, most of the fluid will flow back out of the nose, instead of down your throat.   The solution should be warm, near body temperature, when you irrigate.   Each time you irrigate, you should use a full squeeze bottle.  ° °Note that if you are instructed to use Nasal Steroid Sprays at any time after your surgery, irrigate with saline BEFORE using the steroid spray, so you do not wash it all out of the nose. °Another product, Nasal Saline Gel (such as AYR Nasal Saline Gel) can be applied in each nostril 3 - 4 times daily to moisture the nose and reduce scabbing or crusting. ° °Bleeding:   Bloody drainage from the nose can be expected for several days, and patients are instructed to irrigate their nose frequently with salt water to help remove mucous and blood clots.  The drainage may be dark red or brown, though some fresh blood may be seen intermittently, especially after irrigation.  Do not blow you nose, as bleeding may occur. If you must sneeze, keep your mouth open to allow air to escape through your mouth. ° °If heavy bleeding occurs: Irrigate the nose with saline to rinse out clots, then spray the nose 3 - 4 times with Afrin Nasal Decongestant Spray.  The spray will constrict the blood vessels to slow bleeding.  Pinch the lower half of your nose shut to apply pressure, and lay down with your head elevated.  Ice packs over the nose may help as well. If bleeding persists despite these measures, you should notify your doctor.  Do not use the Afrin routinely to control nasal congestion after surgery, as it can result in worsening congestion and may affect healing.  ° ° ° °Activity: Return to work varies among patients. Most patients will be   out of work at least 5 - 7 days to recover.  Patient may return to work after they are off of narcotic pain medication, and feeling well enough to perform the functions of their job.  Patients must avoid heavy lifting (over 10 pounds) or strenuous physical for 2 weeks after surgery, so your employer may need to assign you to light duty, or keep you out of work longer if light duty is not possible.  NOTE: you should not drive, operate dangerous machinery, do any mentally demanding tasks or make any important legal or financial decisions while on narcotic pain medication and recovering from the general anesthetic.    Call Your Doctor Immediately if You Have Any of the Following: 1. Bleeding that you cannot control with the above measures 2. Loss of vision, double vision, bulging of the eye or black eyes. 3. Fever over 101 degrees 4. Neck stiffness with  severe headache, fever, nausea and change in mental state. You are always encourage to call anytime with concerns, however, please call with requests for pain medication refills during office hours.  Office Endoscopy: During follow-up visits your doctor will remove any packing or splints that may have been placed and evaluate and clean your sinuses endoscopically.  Topical anesthetic will be used to make this as comfortable as possible, though you may want to take your pain medication prior to the visit.  How often this will need to be done varies from patient to patient.  After complete recovery from the surgery, you may need follow-up endoscopy from time to time, particularly if there is concern of recurrent infection or nasal polyps.    Scopolamine skin patches REMOVE PATCH IN 72 HOURS AND [place] HANDS IMMEDIATELY What is this medicine? SCOPOLAMINE (skoe POL a meen) is used to prevent nausea and vomiting caused by motion sickness, anesthesia and surgery. This medicine may be used for other purposes; ask your health care provider or pharmacist if you have questions. COMMON BRAND NAME(S): Transderm Scop What should I tell my health care provider before I take this medicine? They need to know if you have any of these conditions: -glaucoma -kidney or liver disease -an unusual or allergic reaction (especially skin allergy) to scopolamine, atropine, other medicines, foods, dyes, or preservatives -pregnant or trying to get pregnant -breast-feeding How should I use this medicine? This medicine is for external use only. Follow the directions on the prescription label. One patch contains enough medicine to prevent motion sickness for up to 3 days. Apply the patch at least 4 hours before you need it and only wear one disc at a time. Choose an area behind the ear, that is clean, dry, hairless and free from any cuts or irritation. Wipe the area with a clean dry tissue. Peel off the plastic backing of the  skin patch, trying not to touch the adhesive side with your hands. Do not cut the patches. Firmly apply to the area you have chosen, with the metallic side of the patch to the skin and the tan-colored side showing. Once firmly in place, wash your hands well with soap and water. Remove the disc after 3 days, or sooner if you no longer need it. After removing the patch, wash your hands and the area behind your ear thoroughly with soap and water. The patch will still contain some medicine after use. To avoid accidental contact or ingestion by children or pets, fold the used patch in half with the sticky side together and throw away in  the trash out of the reach of children and pets. If you need to use a second patch after you remove the first, place it behind the other ear. Talk to your pediatrician regarding the use of this medicine in children. Special care may be needed. Overdosage: If you think you have taken too much of this medicine contact a poison control center or emergency room at once. NOTE: This medicine is only for you. Do not share this medicine with others. What if I miss a dose? Make sure you apply the patch at least 4 hours before you need it. You can apply it the night before traveling. What may interact with this medicine? -benztropine -bethanechol -medicines for anxiety or sleeping problems like diazepam or temazepam -medicines for hay fever and other allergies -medicines for mental depression -muscle relaxants This list may not describe all possible interactions. Give your health care provider a list of all the medicines, herbs, non-prescription drugs, or dietary supplements you use. Also tell them if you smoke, drink alcohol, or use illegal drugs. Some items may interact with your medicine. What should I watch for while using this medicine? Keep the patch dry, if possible, to prevent it from falling off. Limited contact with water, however, as in bathing or swimming, will not affect  the system. If the patch falls off, throw it away and put a new one behind the other ear. You may get drowsy or dizzy. Do not drive, use machinery, or do anything that needs mental alertness until you know how this medicine affects you. Do not stand or sit up quickly, especially if you are an older patient. This reduces the risk of dizzy or fainting spells. Alcohol may interfere with the effect of this medicine. Avoid alcoholic drinks. Your mouth may get dry. Chewing sugarless gum or sucking hard candy, and drinking plenty of water may help. Contact your doctor if the problem does not go away or is severe. This medicine may cause dry eyes and blurred vision. If you wear contact lenses you may feel some discomfort. Lubricating drops may help. See your eye doctor if the problem does not go away or is severe. If you are going to have a magnetic resonance imaging (MRI) procedure, tell your MRI technician if you have this patch on your body. It must be removed before a MRI. What side effects may I notice from receiving this medicine? Side effects that you should report to your doctor or health care professional as soon as possible: -agitation, nervousness, confusion -blurred vision and other eye problems -dizziness, drowsiness -eye pain or redness in the whites of the eye -hallucinations -pain or difficulty passing urine -skin rash, itching -vomiting Side effects that usually do not require medical attention (report to your doctor or health care professional if they continue or are bothersome): -headache -nausea This list may not describe all possible side effects. Call your doctor for medical advice about side effects. You may report side effects to FDA at 1-800-FDA-1088. Where should I keep my medicine? Keep out of the reach of children. Store at room temperature between 20 and 25 degrees C (68 and 77 degrees F). Throw away any unused medicine after the expiration date. When you remove a patch,  fold it and throw it in the trash as described above. NOTE: This sheet is a summary. It may not cover all possible information. If you have questions about this medicine, talk to your doctor, pharmacist, or health care provider.  2018 Elsevier/Gold Standard (2011-07-15  13:31:48)  General Anesthesia, Adult, Care After These instructions provide you with information about caring for yourself after your procedure. Your health care provider may also give you more specific instructions. Your treatment has been planned according to current medical practices, but problems sometimes occur. Call your health care provider if you have any problems or questions after your procedure. What can I expect after the procedure? After the procedure, it is common to have:  Vomiting.  A sore throat.  Mental slowness.  It is common to feel:  Nauseous.  Cold or shivery.  Sleepy.  Tired.  Sore or achy, even in parts of your body where you did not have surgery.  Follow these instructions at home: For at least 24 hours after the procedure:  Do not: ? Participate in activities where you could fall or become injured. ? Drive. ? Use heavy machinery. ? Drink alcohol. ? Take sleeping pills or medicines that cause drowsiness. ? Make important decisions or sign legal documents. ? Take care of children on your own.  Rest. Eating and drinking  If you vomit, drink water, juice, or soup when you can drink without vomiting.  Drink enough fluid to keep your urine clear or pale yellow.  Make sure you have little or no nausea before eating solid foods.  Follow the diet recommended by your health care provider. General instructions  Have a responsible adult stay with you until you are awake and alert.  Return to your normal activities as told by your health care provider. Ask your health care provider what activities are safe for you.  Take over-the-counter and prescription medicines only as told by  your health care provider.  If you smoke, do not smoke without supervision.  Keep all follow-up visits as told by your health care provider. This is important. Contact a health care provider if:  You continue to have nausea or vomiting at home, and medicines are not helpful.  You cannot drink fluids or start eating again.  You cannot urinate after 8-12 hours.  You develop a skin rash.  You have fever.  You have increasing redness at the site of your procedure. Get help right away if:  You have difficulty breathing.  You have chest pain.  You have unexpected bleeding.  You feel that you are having a life-threatening or urgent problem. This information is not intended to replace advice given to you by your health care provider. Make sure you discuss any questions you have with your health care provider. Document Released: 05/24/2000 Document Revised: 07/21/2015 Document Reviewed: 01/30/2015 Elsevier Interactive Patient Education  [employer].

## 2016-10-21 NOTE — Op Note (Signed)
10/21/2016  11:32 AM    Daisy Soto  [id number]   Pre-Op Dx:  Chronic right ethmoid sinusitis and chronic bilateral maxillary sinusitis, osteoma right ethmoid sinus, chronic headache, enlarged inferior turbinates  Post-op Dx: Same  Proc: Right endoscopic total ethmoidectomy, bilateral endoscopic maxillary antrostomies, removal osteoma right ethmoid sinus, use of image guided system, outfracture inferior turbinates   Surg:  [facility] [place] [facility]  Anes:  GOT  EBL:  50 mL  Comp:  None  Findings:  8 mm size osteoma the right ethmoid sinus creating pressure here. Narrow ostium both maxillary antrums that were widened. Enlarged inferior turbinates that were outfractured  Procedure: The patient was brought to the operating room and given general anesthesia by oral endotracheal intubation. She was in a supine position. Her nose was prepped with cottonoid pledgets soaked in 4% Xylocaine mixed with Afrin. The CT scan was downloaded to the image guided system and then the template was applied to the face. Was registered with 0.7 mm of variance. The suction instruments were then registered and there was good alignment with the suction instruments as well. She was prepped and draped in sterile fashion.  The left cottonoid pledgets were removed and the airway was visualized using the 0 and 30 scopes. The image guided system was used for checking landmarks and depth of dissection. The left inferior turbinate was enlarged so it was outfractured give more room getting into the nose and working around the sinuses. The left middle turbinate was infractured to get into the middle meatus. The uncinate process was injected with local anesthesia first and then was trimmed using the side biter for incising this and the [place] for removal. Part of this was sent for specimen. The maxillary antrum natural ostium was widened using [place] microdebrider. The entire uncinate process was removed. The maxillary  antrum was wide open and the maxillary sinus now could be seen and looked relatively clear. The ethmoid was not opened into. The left side was then packed with a cottonoid pledget soaked in Afrin.  The right side had a cottonoid pledgets removed and then the 0 and 30 scopes were used for visualizing the area. The inferior turbinate was outfractured give little more room going through the airway. The middle turbinate was infractured to visualize the middle meatus. The uncinate process was injected and then again trimmed using the side biters to incise it opened up and the 45 through biters for removing some of it and the [place] for trimming the rest of it. The entire uncinate was removed. The maxillary antrum was widened create a good opening here including the natural ostium. The image guided system was used again for evaluating landmarks and the depth of dissection. Once the maxillary antrum was opened widely the ethmoid bulla was opened. The posterior ethmoid air cells were opened until I was behind the osteoma and could see the bottom edge of it. Dissection was carried into the middle and anterior ethmoid air cells some to get around the osteoma again and tube be able to see it clearly on top and bottom. It was not attached to the roof of the ethmoid but just to the lateral wall. With the ostium and full view was grasped and rocked slightly to fracture it from the lateral wall and then it was removed. There is no significant bleeding. Some loose bone shards were trimmed away. The middle and posterior ethmoid air cells were wide open and clear up to the fovea ethmoidalis. There were couple  anterior ethmoid air cells that were left intact and the frontal sinus duct was intact. These were not addressed as they were not a problem. Cottonoid pledgets were then placed into the area for vasoconstriction.  The left side was revisualized with 0 and 30 scopes. Any little bone chips were cleaned up and  the area was opened nicely in the left maxillary sinus. There is no significant bleeding. Xerogel was then placed into the middle meatus covering over the maxillary antrum. The right side was then revisualized cottonoid pledgets removed. There is no significant bleeding. The right ethmoid sinus was opened and the maxillary sinus was clear as well. Xerogel was then placed in the anterior and posterior ethmoid area and then a second piece was placed more anteriorly to help hold the turbinate medially.  The patient was awakened and taken to the recovery room in satisfactory condition. There were no operative complications.    Dispo:   To PACU to be discharged home  Plan:  To rest at home with head elevated. She'll begin saline flushes tomorrow. She will use antibiotics at home and a prednisone taper. I will give her some Norco 5/325 to use for pain if needed. She is concerned about nausea cell write for some Zofran 4 mg tablets as well to use when necessary for nausea. Follow-up in the office in 1 week  Harrison  10/21/2016 11:32 AM

## 2016-10-21 NOTE — Anesthesia Postprocedure Evaluation (Signed)
Anesthesia Post Note  Patient: Daisy Soto  Procedure(s) Performed: Procedure(s) (LRB): IMAGE GUIDED SINUS SURGERY (N/A) MAXILLARY ANTROSTOMY (Bilateral) RIGHT TOTAL ETHMOIDECTOMY/ REMOVE ASTEOMA WITHIN RIGHT ETHMOID (Right)  Patient location during evaluation: PACU Anesthesia Type: General Level of consciousness: awake and alert, oriented and patient cooperative Pain management: pain level controlled Vital Signs Assessment: post-procedure vital signs reviewed and stable Respiratory status: spontaneous breathing, nonlabored ventilation and respiratory function stable Cardiovascular status: blood pressure returned to baseline and stable Postop Assessment: adequate PO intake Anesthetic complications: no    Darrin Nipper

## 2016-10-21 NOTE — Transfer of Care (Signed)
Immediate Anesthesia Transfer of Care Note  Patient: Daisy Soto  Procedure(s) Performed: Procedure(s) with comments: IMAGE GUIDED SINUS SURGERY (N/A) - disc given to Tabitha 09/23/16 MAXILLARY ANTROSTOMY (Bilateral) RIGHT TOTAL ETHMOIDECTOMY/ REMOVE ASTEOMA WITHIN RIGHT ETHMOID (Right)  Patient Location: PACU  Anesthesia Type: General  Level of Consciousness: awake, alert  and patient cooperative  Airway and Oxygen Therapy: Patient Spontanous Breathing and Patient connected to supplemental oxygen  Post-op Assessment: Post-op Vital signs reviewed, Patient's Cardiovascular Status Stable, Respiratory Function Stable, Patent Airway and No signs of Nausea or vomiting  Post-op Vital Signs: Reviewed and stable  Complications: No apparent anesthesia complications

## 2016-10-21 NOTE — Anesthesia Preprocedure Evaluation (Addendum)
Anesthesia Evaluation  Patient identified by MRN, date of birth, ID band Patient awake    Reviewed: Allergy & Precautions, NPO status , Patient's Chart, lab work & pertinent test results  History of Anesthesia Complications (+) PONV and history of anesthetic complications  Airway Mallampati: I  TM Distance: >3 FB Neck ROM: Full    Dental  (+)    Pulmonary  Snoring    Pulmonary exam normal breath sounds clear to auscultation       Cardiovascular Exercise Tolerance: Good negative cardio ROS Normal cardiovascular exam Rhythm:Regular Rate:Normal     Neuro/Psych PSYCHIATRIC DISORDERS Depression negative neurological ROS     GI/Hepatic GERD  ,  Endo/Other  negative endocrine ROS  Renal/GU negative Renal ROS     Musculoskeletal   Abdominal   Peds  Hematology negative hematology ROS (+)   Anesthesia Other Findings   Reproductive/Obstetrics                             Anesthesia Physical Anesthesia Plan  ASA: II  Anesthesia Plan: General   Post-op Pain Management:    Induction: Intravenous  PONV Risk Score and Plan: 2 and Ondansetron, Dexamethasone, Scopolamine patch - Pre-op and Promethazine  Airway Management Planned: Oral ETT  Additional Equipment:   Intra-op Plan:   Post-operative Plan: Extubation in OR  Informed Consent: I have reviewed the patients History and Physical, chart, labs and discussed the procedure including the risks, benefits and alternatives for the proposed anesthesia with the patient or authorized representative who has indicated his/her understanding and acceptance.     Plan Discussed with: CRNA  Anesthesia Plan Comments: (Pt with hx severe PONV even after endoscopy.  Plan for scopolamine patch, ondansetron, dexamethasone, and minimizing narcotics.)      Anesthesia Quick Evaluation

## 2016-10-22 ENCOUNTER — Encounter: Payer: Self-pay | Admitting: Otolaryngology

## 2016-10-25 LAB — SURGICAL PATHOLOGY

## 2016-10-27 DIAGNOSIS — Z48813 Encounter for surgical aftercare following surgery on the respiratory system: Secondary | ICD-10-CM | POA: Diagnosis not present

## 2016-11-03 DIAGNOSIS — F4320 Adjustment disorder, unspecified: Secondary | ICD-10-CM | POA: Diagnosis not present

## 2016-11-04 DIAGNOSIS — Z48813 Encounter for surgical aftercare following surgery on the respiratory system: Secondary | ICD-10-CM | POA: Diagnosis not present

## 2016-11-11 DIAGNOSIS — J301 Allergic rhinitis due to pollen: Secondary | ICD-10-CM | POA: Diagnosis not present

## 2016-11-17 DIAGNOSIS — J301 Allergic rhinitis due to pollen: Secondary | ICD-10-CM | POA: Diagnosis not present

## 2016-11-18 DIAGNOSIS — Z48813 Encounter for surgical aftercare following surgery on the respiratory system: Secondary | ICD-10-CM | POA: Diagnosis not present

## 2016-11-25 DIAGNOSIS — J301 Allergic rhinitis due to pollen: Secondary | ICD-10-CM | POA: Diagnosis not present

## 2016-12-01 DIAGNOSIS — J301 Allergic rhinitis due to pollen: Secondary | ICD-10-CM | POA: Diagnosis not present

## 2016-12-01 DIAGNOSIS — F4320 Adjustment disorder, unspecified: Secondary | ICD-10-CM | POA: Diagnosis not present

## 2016-12-03 DIAGNOSIS — J301 Allergic rhinitis due to pollen: Secondary | ICD-10-CM | POA: Diagnosis not present

## 2016-12-06 DIAGNOSIS — D224 Melanocytic nevi of scalp and neck: Secondary | ICD-10-CM | POA: Diagnosis not present

## 2016-12-06 DIAGNOSIS — L578 Other skin changes due to chronic exposure to nonionizing radiation: Secondary | ICD-10-CM | POA: Diagnosis not present

## 2016-12-08 DIAGNOSIS — J301 Allergic rhinitis due to pollen: Secondary | ICD-10-CM | POA: Diagnosis not present

## 2016-12-16 DIAGNOSIS — J301 Allergic rhinitis due to pollen: Secondary | ICD-10-CM | POA: Diagnosis not present

## 2016-12-23 DIAGNOSIS — J301 Allergic rhinitis due to pollen: Secondary | ICD-10-CM | POA: Diagnosis not present

## 2016-12-28 ENCOUNTER — Ambulatory Visit: Payer: BLUE CROSS/BLUE SHIELD

## 2016-12-30 DIAGNOSIS — J301 Allergic rhinitis due to pollen: Secondary | ICD-10-CM | POA: Diagnosis not present

## 2017-01-07 ENCOUNTER — Ambulatory Visit: Payer: BLUE CROSS/BLUE SHIELD

## 2017-01-12 DIAGNOSIS — J301 Allergic rhinitis due to pollen: Secondary | ICD-10-CM | POA: Diagnosis not present

## 2017-01-18 ENCOUNTER — Other Ambulatory Visit: Payer: Self-pay | Admitting: Obstetrics and Gynecology

## 2017-01-19 DIAGNOSIS — J301 Allergic rhinitis due to pollen: Secondary | ICD-10-CM | POA: Diagnosis not present

## 2017-01-24 ENCOUNTER — Ambulatory Visit (INDEPENDENT_AMBULATORY_CARE_PROVIDER_SITE_OTHER): Payer: BLUE CROSS/BLUE SHIELD | Admitting: Gastroenterology

## 2017-01-24 ENCOUNTER — Encounter: Payer: Self-pay | Admitting: Gastroenterology

## 2017-01-24 VITALS — BP 122/67 | HR 79

## 2017-01-24 DIAGNOSIS — K219 Gastro-esophageal reflux disease without esophagitis: Secondary | ICD-10-CM

## 2017-01-24 DIAGNOSIS — R194 Change in bowel habit: Secondary | ICD-10-CM

## 2017-01-24 NOTE — Progress Notes (Signed)
Primary Care Physician: Mikey Kirschner, MD  Primary Gastroenterologist:  Dr. Lucilla Lame  Chief Complaint  Patient presents with  . Gastroesophageal Reflux  . Change in bowel habits    HPI: Daisy Soto is a 46 y.o. female here for follow-up of heartburn and alternating diarrhea and constipation. The patient reports that her diarrhea and constipation has been coming with a lot of excessive gas. She also reports that her flatulence smells like sulfur. The patient reports that she has been lactose intolerant for some time and avoids milk products. She does have yogurt at times. The patient denies unexplained weight loss black stools or bloody stools. She also denies that the diarrhea wakes her up in the middle the night. The patient states that she has been having acid breakthrough in the middle of the night more than she does during the day.  Current Outpatient Medications  Medication Sig Dispense Refill  . acyclovir (ZOVIRAX) 400 MG tablet TAKE ONE TABLET BY MOUTH TWO TIMES A DAY 180 tablet 1  . amoxicillin-clavulanate (AUGMENTIN) 875-125 MG tablet Take 1 tablet by mouth 2 (two) times daily. for 10 days  0  . esomeprazole (NEXIUM) 40 MG capsule Take 40 mg by mouth daily at 12 noon.    Marland Kitchen FLUoxetine (PROZAC) 40 MG capsule Take 40 mg by mouth daily.    . fluticasone (FLONASE) 50 MCG/ACT nasal spray     . traZODone (DESYREL) 50 MG tablet 50 mg. Takes one to two qhs prn  0   No current facility-administered medications for this visit.     Allergies as of 01/24/2017 - Review Complete 01/24/2017  Allergen Reaction Noted  . Biaxin [clarithromycin]  07/25/2014    ROS:  General: Negative for anorexia, weight loss, fever, chills, fatigue, weakness. ENT: Negative for hoarseness, difficulty swallowing , nasal congestion. CV: Negative for chest pain, angina, palpitations, dyspnea on exertion, peripheral edema.  Respiratory: Negative for dyspnea at rest, dyspnea on exertion, cough,  sputum, wheezing.  GI: See history of present illness. GU:  Negative for dysuria, hematuria, urinary incontinence, urinary frequency, nocturnal urination.  Endo: Negative for unusual weight change.    Physical Examination:   BP 122/67 (BP Location: Left Arm, Patient Position: Sitting, Cuff Size: Normal)   Pulse 79   General: Well-nourished, well-developed in no acute distress.  Eyes: No icterus. Conjunctivae pink. Mouth: Oropharyngeal mucosa moist and pink , no lesions erythema or exudate. Lungs: Clear to auscultation bilaterally. Non-labored. Heart: Regular rate and rhythm, no murmurs rubs or gallops.  Abdomen: Bowel sounds are normal, nontender, nondistended, no hepatosplenomegaly or masses, no abdominal bruits or hernia , no rebound or guarding.   Extremities: No lower extremity edema. No clubbing or deformities. Neuro: Alert and oriented x 3.  Grossly intact. Skin: Warm and dry, no jaundice.   Psych: Alert and cooperative, normal mood and affect.  Labs:    Imaging Studies: No results found.  Assessment and Plan:   Daisy Soto is a 46 y.o. y/o female who has alternating diarrhea with gas and reports that she has foul-smelling gas. The patient also has had breakthrough heartburn on her over-the-counter dose of Nexium. The patient states that her symptoms are mostly in the night. The patient has been told to take her Nexium in the evening to see if this helps her nocturnal acid reflux. The patient will also try to keep a food log to see which foods may be causing her more gas and diarrhea. The patient has  been told to call our office if she cannot figure out what is causing her symptoms and if the PPI needs to be changed. The patient has been explained the plan and agrees with it.  Lucilla Lame, MD. Marval Regal   Note: This dictation was prepared with Dragon dictation along with smaller phrase technology. Any transcriptional errors that result from this process are unintentional.

## 2017-01-26 DIAGNOSIS — F4320 Adjustment disorder, unspecified: Secondary | ICD-10-CM | POA: Diagnosis not present

## 2017-01-27 DIAGNOSIS — J301 Allergic rhinitis due to pollen: Secondary | ICD-10-CM | POA: Diagnosis not present

## 2017-02-01 DIAGNOSIS — F330 Major depressive disorder, recurrent, mild: Secondary | ICD-10-CM | POA: Diagnosis not present

## 2017-02-01 DIAGNOSIS — F411 Generalized anxiety disorder: Secondary | ICD-10-CM | POA: Diagnosis not present

## 2017-02-02 DIAGNOSIS — B372 Candidiasis of skin and nail: Secondary | ICD-10-CM | POA: Diagnosis not present

## 2017-02-03 DIAGNOSIS — J301 Allergic rhinitis due to pollen: Secondary | ICD-10-CM | POA: Diagnosis not present

## 2017-02-10 DIAGNOSIS — J301 Allergic rhinitis due to pollen: Secondary | ICD-10-CM | POA: Diagnosis not present

## 2017-02-17 DIAGNOSIS — J301 Allergic rhinitis due to pollen: Secondary | ICD-10-CM | POA: Diagnosis not present

## 2017-02-24 DIAGNOSIS — J301 Allergic rhinitis due to pollen: Secondary | ICD-10-CM | POA: Diagnosis not present

## 2017-03-04 DIAGNOSIS — J301 Allergic rhinitis due to pollen: Secondary | ICD-10-CM | POA: Diagnosis not present

## 2017-03-09 DIAGNOSIS — L400 Psoriasis vulgaris: Secondary | ICD-10-CM | POA: Diagnosis not present

## 2017-03-09 DIAGNOSIS — B372 Candidiasis of skin and nail: Secondary | ICD-10-CM | POA: Diagnosis not present

## 2017-03-10 DIAGNOSIS — J301 Allergic rhinitis due to pollen: Secondary | ICD-10-CM | POA: Diagnosis not present

## 2017-03-17 DIAGNOSIS — J301 Allergic rhinitis due to pollen: Secondary | ICD-10-CM | POA: Diagnosis not present

## 2017-03-23 DIAGNOSIS — J305 Allergic rhinitis due to food: Secondary | ICD-10-CM | POA: Diagnosis not present

## 2017-03-24 DIAGNOSIS — J301 Allergic rhinitis due to pollen: Secondary | ICD-10-CM | POA: Diagnosis not present

## 2017-03-30 DIAGNOSIS — J301 Allergic rhinitis due to pollen: Secondary | ICD-10-CM | POA: Diagnosis not present

## 2017-04-07 DIAGNOSIS — J301 Allergic rhinitis due to pollen: Secondary | ICD-10-CM | POA: Diagnosis not present

## 2017-04-21 DIAGNOSIS — J301 Allergic rhinitis due to pollen: Secondary | ICD-10-CM | POA: Diagnosis not present

## 2017-04-28 DIAGNOSIS — J301 Allergic rhinitis due to pollen: Secondary | ICD-10-CM | POA: Diagnosis not present

## 2017-05-05 DIAGNOSIS — J301 Allergic rhinitis due to pollen: Secondary | ICD-10-CM | POA: Diagnosis not present

## 2017-05-12 DIAGNOSIS — J301 Allergic rhinitis due to pollen: Secondary | ICD-10-CM | POA: Diagnosis not present

## 2017-05-19 DIAGNOSIS — J301 Allergic rhinitis due to pollen: Secondary | ICD-10-CM | POA: Diagnosis not present

## 2017-05-19 DIAGNOSIS — F411 Generalized anxiety disorder: Secondary | ICD-10-CM | POA: Diagnosis not present

## 2017-05-19 DIAGNOSIS — F330 Major depressive disorder, recurrent, mild: Secondary | ICD-10-CM | POA: Diagnosis not present

## 2017-05-26 DIAGNOSIS — J301 Allergic rhinitis due to pollen: Secondary | ICD-10-CM | POA: Diagnosis not present

## 2017-05-27 DIAGNOSIS — J301 Allergic rhinitis due to pollen: Secondary | ICD-10-CM | POA: Diagnosis not present

## 2017-06-02 DIAGNOSIS — J301 Allergic rhinitis due to pollen: Secondary | ICD-10-CM | POA: Diagnosis not present

## 2017-06-08 DIAGNOSIS — J301 Allergic rhinitis due to pollen: Secondary | ICD-10-CM | POA: Diagnosis not present

## 2017-06-16 DIAGNOSIS — J301 Allergic rhinitis due to pollen: Secondary | ICD-10-CM | POA: Diagnosis not present

## 2017-06-23 DIAGNOSIS — J301 Allergic rhinitis due to pollen: Secondary | ICD-10-CM | POA: Diagnosis not present

## 2017-07-07 DIAGNOSIS — J301 Allergic rhinitis due to pollen: Secondary | ICD-10-CM | POA: Diagnosis not present

## 2017-07-08 DIAGNOSIS — N390 Urinary tract infection, site not specified: Secondary | ICD-10-CM | POA: Diagnosis not present

## 2017-07-10 DIAGNOSIS — N390 Urinary tract infection, site not specified: Secondary | ICD-10-CM | POA: Diagnosis not present

## 2017-07-21 DIAGNOSIS — J301 Allergic rhinitis due to pollen: Secondary | ICD-10-CM | POA: Diagnosis not present

## 2017-08-11 DIAGNOSIS — J301 Allergic rhinitis due to pollen: Secondary | ICD-10-CM | POA: Diagnosis not present

## 2017-08-18 DIAGNOSIS — J301 Allergic rhinitis due to pollen: Secondary | ICD-10-CM | POA: Diagnosis not present

## 2017-08-25 ENCOUNTER — Encounter: Payer: Self-pay | Admitting: Obstetrics and Gynecology

## 2017-08-25 ENCOUNTER — Ambulatory Visit (INDEPENDENT_AMBULATORY_CARE_PROVIDER_SITE_OTHER): Payer: BLUE CROSS/BLUE SHIELD | Admitting: Obstetrics and Gynecology

## 2017-08-25 VITALS — BP 109/67 | HR 101 | Ht 65.0 in | Wt 164.3 lb

## 2017-08-25 DIAGNOSIS — Z1239 Encounter for other screening for malignant neoplasm of breast: Secondary | ICD-10-CM

## 2017-08-25 DIAGNOSIS — Z8619 Personal history of other infectious and parasitic diseases: Secondary | ICD-10-CM | POA: Diagnosis not present

## 2017-08-25 DIAGNOSIS — Z01419 Encounter for gynecological examination (general) (routine) without abnormal findings: Secondary | ICD-10-CM | POA: Diagnosis not present

## 2017-08-25 DIAGNOSIS — F3289 Other specified depressive episodes: Secondary | ICD-10-CM

## 2017-08-25 DIAGNOSIS — Z98891 History of uterine scar from previous surgery: Secondary | ICD-10-CM

## 2017-08-25 DIAGNOSIS — Z90710 Acquired absence of both cervix and uterus: Secondary | ICD-10-CM

## 2017-08-25 DIAGNOSIS — Z8742 Personal history of other diseases of the female genital tract: Secondary | ICD-10-CM

## 2017-08-25 DIAGNOSIS — J301 Allergic rhinitis due to pollen: Secondary | ICD-10-CM | POA: Diagnosis not present

## 2017-08-25 DIAGNOSIS — Z1231 Encounter for screening mammogram for malignant neoplasm of breast: Secondary | ICD-10-CM | POA: Diagnosis not present

## 2017-08-25 MED ORDER — ACYCLOVIR 400 MG PO TABS: 400.0000 mg | ORAL_TABLET | Freq: Two times a day (BID) | ORAL | 1 refills | Status: DC

## 2017-08-25 NOTE — Patient Instructions (Addendum)
1.  No Pap smear is done.  No further Pap smears are needed. 2.  Mammogram is ordered. 3.  Screening labs are to be obtained through primary care 4.  Continue with healthy eating and exercise. 5.  Return in 1 year for annual exam 6.  Continue acyclovir 400 mg twice a day for prophylaxis of herpes genitalis    Health Maintenance for Postmenopausal Women Menopause is a normal process in which your reproductive ability comes to an end. This process happens gradually over a span of months to years, usually between the ages of 42 and 51. Menopause is complete when you have missed 12 consecutive menstrual periods. It is important to talk with your health care provider about some of the most common conditions that affect postmenopausal women, such as heart disease, cancer, and bone loss (osteoporosis). Adopting a healthy lifestyle and getting preventive care can help to promote your health and wellness. Those actions can also lower your chances of developing some of these common conditions. What should I know about menopause? During menopause, you may experience a number of symptoms, such as:  Moderate-to-severe hot flashes.  Night sweats.  Decrease in sex drive.  Mood swings.  Headaches.  Tiredness.  Irritability.  Memory problems.  Insomnia.  Choosing to treat or not to treat menopausal changes is an individual decision that you make with your health care provider. What should I know about hormone replacement therapy and supplements? Hormone therapy products are effective for treating symptoms that are associated with menopause, such as hot flashes and night sweats. Hormone replacement carries certain risks, especially as you become older. If you are thinking about using estrogen or estrogen with progestin treatments, discuss the benefits and risks with your health care provider. What should I know about heart disease and stroke? Heart disease, heart attack, and stroke become more  likely as you age. This may be due, in part, to the hormonal changes that your body experiences during menopause. These can affect how your body processes dietary fats, triglycerides, and cholesterol. Heart attack and stroke are both medical emergencies. There are many things that you can do to help prevent heart disease and stroke:  Have your blood pressure checked at least every 1-2 years. High blood pressure causes heart disease and increases the risk of stroke.  If you are 36-81 years old, ask your health care provider if you should take aspirin to prevent a heart attack or a stroke.  Do not use any tobacco products, including cigarettes, chewing tobacco, or electronic cigarettes. If you need help quitting, ask your health care provider.  It is important to eat a healthy diet and maintain a healthy weight. ? Be sure to include plenty of vegetables, fruits, low-fat dairy products, and lean protein. ? Avoid eating foods that are high in solid fats, added sugars, or salt (sodium).  Get regular exercise. This is one of the most important things that you can do for your health. ? Try to exercise for at least 150 minutes each week. The type of exercise that you do should increase your heart rate and make you sweat. This is known as moderate-intensity exercise. ? Try to do strengthening exercises at least twice each week. Do these in addition to the moderate-intensity exercise.  Know your numbers.Ask your health care provider to check your cholesterol and your blood glucose. Continue to have your blood tested as directed by your health care provider.  What should I know about cancer screening? There are several  types of cancer. Take the following steps to reduce your risk and to catch any cancer development as early as possible. Breast Cancer  Practice breast self-awareness. ? This means understanding how your breasts normally appear and feel. ? It also means doing regular breast self-exams.  Let your health care provider know about any changes, no matter how small.  If you are 29 or older, have a clinician do a breast exam (clinical breast exam or CBE) every year. Depending on your age, family history, and medical history, it may be recommended that you also have a yearly breast X-ray (mammogram).  If you have a family history of breast cancer, talk with your health care provider about genetic screening.  If you are at high risk for breast cancer, talk with your health care provider about having an MRI and a mammogram every year.  Breast cancer (BRCA) gene test is recommended for women who have family members with BRCA-related cancers. Results of the assessment will determine the need for genetic counseling and BRCA1 and for BRCA2 testing. BRCA-related cancers include these types: ? Breast. This occurs in males or females. ? Ovarian. ? Tubal. This may also be called fallopian tube cancer. ? Cancer of the abdominal or pelvic lining (peritoneal cancer). ? Prostate. ? Pancreatic.  Cervical, Uterine, and Ovarian Cancer Your health care provider may recommend that you be screened regularly for cancer of the pelvic organs. These include your ovaries, uterus, and vagina. This screening involves a pelvic exam, which includes checking for microscopic changes to the surface of your cervix (Pap test).  For women ages 21-65, health care providers may recommend a pelvic exam and a Pap test every three years. For women ages 42-65, they may recommend the Pap test and pelvic exam, combined with testing for human papilloma virus (HPV), every five years. Some types of HPV increase your risk of cervical cancer. Testing for HPV may also be done on women of any age who have unclear Pap test results.  Other health care providers may not recommend any screening for nonpregnant women who are considered low risk for pelvic cancer and have no symptoms. Ask your health care provider if a screening pelvic exam  is right for you.  If you have had past treatment for cervical cancer or a condition that could lead to cancer, you need Pap tests and screening for cancer for at least 20 years after your treatment. If Pap tests have been discontinued for you, your risk factors (such as having a new sexual partner) need to be reassessed to determine if you should start having screenings again. Some women have medical problems that increase the chance of getting cervical cancer. In these cases, your health care provider may recommend that you have screening and Pap tests more often.  If you have a family history of uterine cancer or ovarian cancer, talk with your health care provider about genetic screening.  If you have vaginal bleeding after reaching menopause, tell your health care provider.  There are currently no reliable tests available to screen for ovarian cancer.  Lung Cancer Lung cancer screening is recommended for adults 21-70 years old who are at high risk for lung cancer because of a history of smoking. A yearly low-dose CT scan of the lungs is recommended if you:  Currently smoke.  Have a history of at least 30 pack-years of smoking and you currently smoke or have quit within the past 15 years. A pack-year is smoking an average of one  pack of cigarettes per day for one year.  Yearly screening should:  Continue until it has been 15 years since you quit.  Stop if you develop a health problem that would prevent you from having lung cancer treatment.  Colorectal Cancer  This type of cancer can be detected and can often be prevented.  Routine colorectal cancer screening usually begins at age 22 and continues through age 5.  If you have risk factors for colon cancer, your health care provider may recommend that you be screened at an earlier age.  If you have a family history of colorectal cancer, talk with your health care provider about genetic screening.  Your health care provider may also  recommend using home test kits to check for hidden blood in your stool.  A small camera at the end of a tube can be used to examine your colon directly (sigmoidoscopy or colonoscopy). This is done to check for the earliest forms of colorectal cancer.  Direct examination of the colon should be repeated every 5-10 years until age 54. However, if early forms of precancerous polyps or small growths are found or if you have a family history or genetic risk for colorectal cancer, you may need to be screened more often.  Skin Cancer  Check your skin from head to toe regularly.  Monitor any moles. Be sure to tell your health care provider: ? About any new moles or changes in moles, especially if there is a change in a mole's shape or color. ? If you have a mole that is larger than the size of a pencil eraser.  If any of your family members has a history of skin cancer, especially at a young age, talk with your health care provider about genetic screening.  Always use sunscreen. Apply sunscreen liberally and repeatedly throughout the day.  Whenever you are outside, protect yourself by wearing long sleeves, pants, a wide-brimmed hat, and sunglasses.  What should I know about osteoporosis? Osteoporosis is a condition in which bone destruction happens more quickly than new bone creation. After menopause, you may be at an increased risk for osteoporosis. To help prevent osteoporosis or the bone fractures that can happen because of osteoporosis, the following is recommended:  If you are 67-62 years old, get at least 1,000 mg of calcium and at least 600 mg of vitamin D per day.  If you are older than age 31 but younger than age 23, get at least 1,200 mg of calcium and at least 600 mg of vitamin D per day.  If you are older than age 47, get at least 1,200 mg of calcium and at least 800 mg of vitamin D per day.  Smoking and excessive alcohol intake increase the risk of osteoporosis. Eat foods that are  rich in calcium and vitamin D, and do weight-bearing exercises several times each week as directed by your health care provider. What should I know about how menopause affects my mental health? Depression may occur at any age, but it is more common as you become older. Common symptoms of depression include:  Low or sad mood.  Changes in sleep patterns.  Changes in appetite or eating patterns.  Feeling an overall lack of motivation or enjoyment of activities that you previously enjoyed.  Frequent crying spells.  Talk with your health care provider if you think that you are experiencing depression. What should I know about immunizations? It is important that you get and maintain your immunizations. These include:  Tetanus, diphtheria, and pertussis (Tdap) booster vaccine.  Influenza every year before the flu season begins.  Pneumonia vaccine.  Shingles vaccine.  Your health care provider may also recommend other immunizations. This information is not intended to replace advice given to you by your health care provider. Make sure you discuss any questions you have with your health care provider. Document Released: 04/09/2005 Document Revised: 09/05/2015 Document Reviewed: 11/19/2014 Elsevier Interactive Patient Education  2018 [employer]  

## 2017-08-25 NOTE — Progress Notes (Signed)
ANNUAL PREVENTATIVE CARE GYN  ENCOUNTER NOTE  Subjective:       Daisy Soto is a 47 y.o. G65P1001 female here for a routine annual gynecologic exam.  Current complaints: 1.  None  Reestablishing care. History of cesarean section for breech status post unsuccessful external cephalic version. History of endometriosis, symptomatic; status post LAVH RSO; currently asymptomatic. History of herpes genitalis; no recent outbreaks; currently taking acyclovir 400 mg twice a day or daily for suppression History of anxiety/depression; on Prozac; followed by Dr. Cephus Shelling.   Gynecologic History No LMP recorded. Patient has had a hysterectomy. Contraception: LAVH RSO Last Pap: 08/05/2016 ascus/neg Results were: abnormal Last mammogram:06/2015 birad 1  Results were: normal  Obstetric History OB History  Gravida Para Term Preterm AB Living  1 1 1     1   SAB TAB Ectopic Multiple Live Births          1    # Outcome Date GA Lbr Len/2nd Weight Sex Delivery Anes PTL Lv  1 Term 2000   7 lb (3.175 kg) F    LIV    Past Medical History:  Diagnosis Date  . Depression   . Desensitization to allergy shot   . GERD (gastroesophageal reflux disease)   . Herpes genitalia   . PONV (postoperative nausea and vomiting)     Past Surgical History:  Procedure Laterality Date  . ABDOMINAL HYSTERECTOMY  2007  . CESAREAN SECTION  4- 2000  . ETHMOIDECTOMY Right 10/21/2016   Procedure: RIGHT TOTAL ETHMOIDECTOMY/ REMOVE ASTEOMA WITHIN RIGHT ETHMOID;  Surgeon: Margaretha Sheffield, MD;  Location: [place];  Service: ENT;  Laterality: Right;  . IMAGE GUIDED SINUS SURGERY N/A 10/21/2016   Procedure: IMAGE GUIDED SINUS SURGERY;  Surgeon: Margaretha Sheffield, MD;  Location: [place];  Service: ENT;  Laterality: N/A;  disc given to [facility] 09/23/16  . MAXILLARY ANTROSTOMY Bilateral 10/21/2016   Procedure: MAXILLARY ANTROSTOMY;  Surgeon: Margaretha Sheffield, MD;  Location: [place];  Service: ENT;   Laterality: Bilateral;    Current Outpatient Medications on File Prior to Visit  Medication Sig Dispense Refill  . acyclovir (ZOVIRAX) 400 MG tablet TAKE ONE TABLET BY MOUTH TWO TIMES A DAY 180 tablet 1  . esomeprazole (NEXIUM) 40 MG capsule Take 40 mg by mouth daily at 12 noon.    Marland Kitchen FLUoxetine (PROZAC) 40 MG capsule Take 40 mg by mouth daily.    . fluticasone (FLONASE) 50 MCG/ACT nasal spray     . Olopatadine HCl 0.2 % SOLN INSTILL 1 DROP INTO BOTH EYES EVERY DAY  1  . traZODone (DESYREL) 50 MG tablet 50 mg. Takes one to two qhs prn  0   No current facility-administered medications on file prior to visit.     Allergies  Allergen Reactions  . Biaxin [Clarithromycin]     Makes stomach burn    Social History   Socioeconomic History  . Marital status: Married    Spouse name: Not on file  . Number of children: Not on file  . Years of education: Not on file  . Highest education level: Not on file  Occupational History  . Not on file  Social Needs  . Financial resource strain: Not on file  . Food insecurity:    Worry: Not on file    Inability: Not on file  . Transportation needs:    Medical: Not on file    Non-medical: Not on file  Tobacco Use  . Smoking status:  Never Smoker  . Smokeless tobacco: Never Used  Substance and Sexual Activity  . Alcohol use: Yes    Alcohol/week: 0.0 oz    Comment: rare consumption  . Drug use: No  . Sexual activity: Yes    Birth control/protection: Surgical  Lifestyle  . Physical activity:    Days per week: Not on file    Minutes per session: Not on file  . Stress: Not on file  Relationships  . Social connections:    Talks on phone: Not on file    Gets together: Not on file    Attends religious service: Not on file    Active member of club or organization: Not on file    Attends meetings of clubs or organizations: Not on file    Relationship status: Not on file  . Intimate partner violence:    Fear of current or ex partner: Not on  file    Emotionally abused: Not on file    Physically abused: Not on file    Forced sexual activity: Not on file  Other Topics Concern  . Not on file  Social History Narrative  . Not on file    Family History  Problem Relation Age of Onset  . Stroke Mother   . Hypertension Mother   . Heart disease Father   . Hypertension Father   . Ovarian cancer Maternal Grandmother 80    The following portions of the patient's history were reviewed and updated as appropriate: allergies, current medications, past family history, past medical history, past social history, past surgical history and problem list.  Review of Systems Review of Systems  Constitutional:       No significant vasomotor symptoms  HENT: Negative.   Eyes: Negative.   Respiratory: Negative.   Cardiovascular: Negative.   Gastrointestinal: Negative.   Genitourinary: Negative.   Musculoskeletal: Negative.   Skin:       No recent HSV outbreaks  Neurological: Negative.   Endo/Heme/Allergies: Negative.   Psychiatric/Behavioral: Positive for depression.      Objective:   BP 109/67   Pulse (!) 101   Ht 5\' 5"  (1.651 m)   Wt 164 lb 4.8 oz (74.5 kg)   BMI 27.34 kg/m  CONSTITUTIONAL: Well-developed, well-nourished female in no acute distress.  PSYCHIATRIC: Normal mood and affect. Normal behavior. Normal judgment and thought content. [place]: Alert and oriented to person, place, and time. Normal muscle tone coordination. No cranial nerve deficit noted. HENT:  Normocephalic, atraumatic, External right and left ear normal.  EYES: Conjunctivae and EOM are normal. No scleral icterus.  NECK: Normal range of motion, supple, no masses.  Normal thyroid.  SKIN: Skin is warm and dry. No rash noted. Not diaphoretic. No erythema. No pallor. CARDIOVASCULAR: Normal heart rate noted, regular rhythm, no murmur. RESPIRATORY: Clear to auscultation bilaterally. Effort and breath sounds normal, no problems with respiration  noted. BREASTS: Symmetric in size. No masses, skin changes, nipple drainage, or lymphadenopathy. ABDOMEN: Soft, normal bowel sounds, no distention noted.  No tenderness, rebound or guarding.  BLADDER: Normal PELVIC:  External Genitalia: Normal  BUS: Normal  Vagina: Normal estrogen effect; good vault support  Cervix: Surgically absent  Uterus: Surgically absent  Adnexa: Nonpalpable and nontender  RV: External Exam NormaI, No Rectal Masses and Normal Sphincter tone  MUSCULOSKELETAL: Normal range of motion. No tenderness.  No cyanosis, clubbing, or edema.  2+ distal pulses. LYMPHATIC: No Axillary, Supraclavicular, or Inguinal Adenopathy.    Assessment:   Annual gynecologic examination 47  y.o. Contraception: status post hysterectomy [id number] History of cesarean section History of endometriosis, asymptomatic History of LAVH RSO History of herpes genitalis, on suppression, without recurrence  Plan:  Pap: Not needed Mammogram: Ordered Stool Guaiac Testing:  Not Indicated Labs: thru pcp Routine preventative health maintenance measures emphasized: Exercise/Diet/Weight control, Tobacco Warnings, Alcohol/Substance use risks and Stress Management Refill acyclovir 400 mg twice a day Signs and symptoms of menopause were discussed Return to [place], CMA  Brayton Mars, MD  Note: This dictation was prepared with Dragon dictation along with smaller phrase technology. Any transcriptional errors that result from this process are unintentional.

## 2017-08-26 DIAGNOSIS — J301 Allergic rhinitis due to pollen: Secondary | ICD-10-CM | POA: Diagnosis not present

## 2017-09-14 DIAGNOSIS — F330 Major depressive disorder, recurrent, mild: Secondary | ICD-10-CM | POA: Diagnosis not present

## 2017-09-14 DIAGNOSIS — F411 Generalized anxiety disorder: Secondary | ICD-10-CM | POA: Diagnosis not present

## 2017-11-08 DIAGNOSIS — J329 Chronic sinusitis, unspecified: Secondary | ICD-10-CM | POA: Diagnosis not present

## 2017-11-08 DIAGNOSIS — J309 Allergic rhinitis, unspecified: Secondary | ICD-10-CM | POA: Diagnosis not present

## 2017-11-21 ENCOUNTER — Ambulatory Visit
Admission: RE | Admit: 2017-11-21 | Discharge: 2017-11-21 | Disposition: A | Discharge status: Home / Self Care | Payer: BLUE CROSS/BLUE SHIELD | Source: Ambulatory Visit | Attending: Obstetrics and Gynecology | Admitting: Obstetrics and Gynecology

## 2017-11-21 DIAGNOSIS — Z1231 Encounter for screening mammogram for malignant neoplasm of breast: Secondary | ICD-10-CM | POA: Insufficient documentation

## 2017-11-21 DIAGNOSIS — Z1239 Encounter for other screening for malignant neoplasm of breast: Secondary | ICD-10-CM

## 2017-12-02 ENCOUNTER — Other Ambulatory Visit: Payer: Self-pay | Admitting: *Deleted

## 2017-12-02 ENCOUNTER — Inpatient Hospital Stay
Admission: RE | Admit: 2017-12-02 | Discharge: 2017-12-02 | Disposition: A | Discharge status: Home / Self Care | Payer: Self-pay | Source: Ambulatory Visit | Attending: *Deleted | Admitting: *Deleted

## 2017-12-02 DIAGNOSIS — Z9289 Personal history of other medical treatment: Secondary | ICD-10-CM

## 2017-12-20 DIAGNOSIS — Z23 Encounter for immunization: Secondary | ICD-10-CM | POA: Diagnosis not present

## 2017-12-21 ENCOUNTER — Ambulatory Visit: Payer: BLUE CROSS/BLUE SHIELD | Admitting: Gastroenterology

## 2018-01-04 ENCOUNTER — Encounter: Payer: Self-pay | Admitting: Gastroenterology

## 2018-01-05 DIAGNOSIS — M4726 Other spondylosis with radiculopathy, lumbar region: Secondary | ICD-10-CM | POA: Diagnosis not present

## 2018-01-11 DIAGNOSIS — M545 Low back pain: Secondary | ICD-10-CM | POA: Diagnosis not present

## 2018-01-12 DIAGNOSIS — F330 Major depressive disorder, recurrent, mild: Secondary | ICD-10-CM | POA: Diagnosis not present

## 2018-01-12 DIAGNOSIS — F411 Generalized anxiety disorder: Secondary | ICD-10-CM | POA: Diagnosis not present

## 2018-01-18 DIAGNOSIS — D2272 Melanocytic nevi of left lower limb, including hip: Secondary | ICD-10-CM | POA: Diagnosis not present

## 2018-01-18 DIAGNOSIS — L814 Other melanin hyperpigmentation: Secondary | ICD-10-CM | POA: Diagnosis not present

## 2018-01-18 DIAGNOSIS — D2239 Melanocytic nevi of other parts of face: Secondary | ICD-10-CM | POA: Diagnosis not present

## 2018-01-18 DIAGNOSIS — L821 Other seborrheic keratosis: Secondary | ICD-10-CM | POA: Diagnosis not present

## 2018-01-30 DIAGNOSIS — M5136 Other intervertebral disc degeneration, lumbar region: Secondary | ICD-10-CM | POA: Diagnosis not present

## 2018-01-30 DIAGNOSIS — M47816 Spondylosis without myelopathy or radiculopathy, lumbar region: Secondary | ICD-10-CM | POA: Diagnosis not present

## 2018-01-30 DIAGNOSIS — M545 Low back pain: Secondary | ICD-10-CM | POA: Diagnosis not present

## 2018-02-01 NOTE — Progress Notes (Signed)
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 The  She has anxiety, depression, and a history of endometriosis.  Seen at the [facility] by Dr. Candace Cruise in 2014 for severe acid reflux. Nocturnal symptoms. Had EGD and colonoscopy at that time. EGD showed reflux. Colonoscopy was normal.    The patient was previously followed by Dr. Lucilla Lame and was last seen in November 2018. His notes show alternating diarrhea with malodorous gas as well as breakthrough heartburn on over-the-counter Nexium.  Presents today to establish care given her ongoing reflux.  Intermittent dysphonia and sore throat. No dysphagia or odynophagia.  Associated epigastric pain. The pain will waken her. Sits upright or sleeps in a recliner to avoid the symptoms. Has good days and bad days. Watches fatty foods, spicy foods, tomato foods. Has tried Dexilant daily alternating with Nexium daily. Now with breakthrough symptoms.   Intermittent significant bloating, malodorous flatus, and alternating constipation and diarrhea x 6-8 years. Using Miralax if no BM after 4 days. Triggered by some breads/yeast rolls. Intolerant to sugar.   She has known lactose intolerance.   Sinusitis last year requiring months of antibiotics. Wondering if this could be related.   Prior studies in Epic: Abdominal ultrasound 01/11/2014 was normal. HIDA scan  01/18/2014 was normal.  Gallbladder ejection fraction was 83%.    Past Medical History:  Diagnosis Date  . Depression   . Desensitization to allergy shot   . GERD (gastroesophageal reflux disease)   . Herpes genitalia   . PONV (postoperative nausea and vomiting)     Past Surgical History:  Procedure Laterality Date  . ABDOMINAL HYSTERECTOMY  2007  . CESAREAN SECTION  4- 2000  . ETHMOIDECTOMY Right 10/21/2016   Procedure: RIGHT TOTAL ETHMOIDECTOMY/ REMOVE ASTEOMA WITHIN RIGHT ETHMOID;  Surgeon: Margaretha Sheffield, MD;  Location: [place];  Service: ENT;  Laterality: Right;  . IMAGE GUIDED SINUS SURGERY N/A 10/21/2016   Procedure: IMAGE GUIDED SINUS SURGERY;  Surgeon: Margaretha Sheffield, MD;  Location: [place];  Service: ENT;  Laterality: N/A;  disc given to [facility] 09/23/16  . MAXILLARY ANTROSTOMY Bilateral 10/21/2016   Procedure: MAXILLARY ANTROSTOMY;  Surgeon: Margaretha Sheffield, MD;  Location: [place];  Service: ENT;  Laterality: Bilateral;    Current Outpatient Medications  Medication Sig Dispense Refill  . acyclovir (ZOVIRAX) 400 MG tablet Take 1 tablet (400 mg total) by mouth 2 (two) times daily. 180 tablet 1  . FLUoxetine (PROZAC) 40 MG capsule Take 40 mg by mouth daily.    . fluticasone (FLONASE) 50 MCG/ACT nasal spray     . Olopatadine HCl 0.2 % SOLN INSTILL 1 DROP INTO BOTH EYES EVERY DAY  1  . traZODone (DESYREL) 50 MG tablet 50 mg. Takes one to two qhs prn  0  . esomeprazole (NEXIUM) 40 MG capsule Take 1 capsule (40 mg total) by mouth 2 (two) times daily before a meal. 60 capsule  3   No current facility-administered medications for this visit.     Allergies as of 02/03/2018 - Review Complete 02/03/2018  Allergen Reaction Noted  . Biaxin [clarithromycin]  07/25/2014    Family History  Problem Relation Age of Onset  . Stroke Mother   . Hypertension Mother   . Heart disease Father   . Hypertension Father   . Ovarian cancer Maternal Grandmother  80  . Breast cancer Neg Hx   . Colon cancer Neg Hx   . Diabetes Neg Hx     Social History   Socioeconomic History  . Marital status: Married    Spouse name: Not on file  . Number of children: Not on file  . Years of education: Not on file  . Highest education level: Not on file  Occupational History  . Not on file  Social Needs  . Financial resource strain: Not on file  . Food insecurity:    Worry: Not on file    Inability: Not on file  . Transportation needs:    Medical: Not on file    Non-medical: Not on file  Tobacco Use  . Smoking status: Never Smoker  . Smokeless tobacco: Never Used  Substance and Sexual Activity  . Alcohol use: Yes    Alcohol/week: 0.0 standard drinks    Comment: rare consumption  . Drug use: No  . Sexual activity: Yes    Birth control/protection: Surgical  Lifestyle  . Physical activity:    Days per week: 4 days    Minutes per session: 20 min  . Stress: Not on file  Relationships  . Social connections:    Talks on phone: Not on file    Gets together: Not on file    Attends religious service: Not on file    Active member of club or organization: Not on file    Attends meetings of clubs or organizations: Not on file    Relationship status: Not on file  . Intimate partner violence:    Fear of current or ex partner: Not on file    Emotionally abused: Not on file    Physically abused: Not on file    Forced sexual activity: Not on file  Other Topics Concern  . Not on file  Social History Narrative  . Not on file    Review of Systems: 12 system ROS is negative except as noted above.  Filed Weights   02/03/18 1323  Weight: 160 lb (72.6 kg)    Physical Exam: Vital signs were reviewed. General:   Alert, well-nourished, pleasant and cooperative in NAD Head:  Normocephalic and atraumatic. Eyes:  Sclera clear, no icterus.   Conjunctiva pink. Mouth:  No deformity or lesions.   Neck:  Supple; no thyromegaly. Lungs:  Clear throughout to  auscultation.   No wheezes.  Heart:  Regular rate and rhythm; no murmurs Abdomen:  Soft, nontender, normal bowel sounds. No rebound or guarding. No hepatosplenomegaly Rectal:  Deferred  Msk:  Symmetrical without gross deformities. Extremities:  No gross deformities or edema. Neurologic:  Alert and  oriented x4;  grossly nonfocal Skin:  No rash or bruise. Psych:  Alert and cooperative. Normal mood and affect.   Laurren Lepkowski L. Tarri Glenn, MD, MPH [facility] 02/13/2018, 8:57 PM

## 2018-02-03 ENCOUNTER — Ambulatory Visit: Payer: BLUE CROSS/BLUE SHIELD | Admitting: Gastroenterology

## 2018-02-03 ENCOUNTER — Encounter: Payer: Self-pay | Admitting: Gastroenterology

## 2018-02-03 VITALS — BP 104/68 | HR 72 | Ht 65.0 in | Wt 160.0 lb

## 2018-02-03 DIAGNOSIS — R140 Abdominal distension (gaseous): Secondary | ICD-10-CM | POA: Diagnosis not present

## 2018-02-03 DIAGNOSIS — E739 Lactose intolerance, unspecified: Secondary | ICD-10-CM

## 2018-02-03 DIAGNOSIS — R198 Other specified symptoms and signs involving the digestive system and abdomen: Secondary | ICD-10-CM | POA: Diagnosis not present

## 2018-02-03 DIAGNOSIS — K219 Gastro-esophageal reflux disease without esophagitis: Secondary | ICD-10-CM

## 2018-02-03 MED ORDER — ESOMEPRAZOLE MAGNESIUM 40 MG PO CPDR: 40.0000 mg | DELAYED_RELEASE_CAPSULE | Freq: Two times a day (BID) | ORAL | 3 refills | Status: DC

## 2018-02-03 NOTE — Patient Instructions (Signed)
You have been given a testing kit to check for small intestine bacterial overgrowth (SIBO) which is completed by a company named [facility]. Make sure to return your test in the mail using the return mailing label given you along with the kit. Your demographic and insurance information have already been sent to the company and they should be in contact with you over the next week regarding this test. Please keep in mind that you will be getting a call from phone number [phone] or a similar number. If you do not hear from them within this time frame, please call our office at [phone].   Increase Nexium to 40 mg twice a day.

## 2018-02-05 DIAGNOSIS — K219 Gastro-esophageal reflux disease without esophagitis: Secondary | ICD-10-CM | POA: Diagnosis not present

## 2018-02-05 DIAGNOSIS — E739 Lactose intolerance, unspecified: Secondary | ICD-10-CM | POA: Diagnosis not present

## 2018-02-10 ENCOUNTER — Telehealth: Payer: Self-pay | Admitting: Gastroenterology

## 2018-02-10 NOTE — Telephone Encounter (Signed)
Order refaxed with signature.

## 2018-02-10 NOTE — Telephone Encounter (Signed)
Daisy Soto from [employer] stating they need the order form for pt breath test signed by [facility] and faxed to [phone].

## 2018-02-13 ENCOUNTER — Encounter: Payer: Self-pay | Admitting: Gastroenterology

## 2018-02-16 DIAGNOSIS — M545 Low back pain: Secondary | ICD-10-CM | POA: Diagnosis not present

## 2018-02-16 DIAGNOSIS — M5416 Radiculopathy, lumbar region: Secondary | ICD-10-CM | POA: Diagnosis not present

## 2018-02-16 DIAGNOSIS — M5116 Intervertebral disc disorders with radiculopathy, lumbar region: Secondary | ICD-10-CM | POA: Diagnosis not present

## 2018-02-20 DIAGNOSIS — M5416 Radiculopathy, lumbar region: Secondary | ICD-10-CM | POA: Diagnosis not present

## 2018-02-20 DIAGNOSIS — M545 Low back pain: Secondary | ICD-10-CM | POA: Diagnosis not present

## 2018-02-20 DIAGNOSIS — M5116 Intervertebral disc disorders with radiculopathy, lumbar region: Secondary | ICD-10-CM | POA: Diagnosis not present

## 2018-02-23 DIAGNOSIS — M545 Low back pain: Secondary | ICD-10-CM | POA: Diagnosis not present

## 2018-02-23 DIAGNOSIS — M5416 Radiculopathy, lumbar region: Secondary | ICD-10-CM | POA: Diagnosis not present

## 2018-02-23 DIAGNOSIS — M5116 Intervertebral disc disorders with radiculopathy, lumbar region: Secondary | ICD-10-CM | POA: Diagnosis not present

## 2018-02-28 DIAGNOSIS — M5116 Intervertebral disc disorders with radiculopathy, lumbar region: Secondary | ICD-10-CM | POA: Diagnosis not present

## 2018-02-28 DIAGNOSIS — M545 Low back pain: Secondary | ICD-10-CM | POA: Diagnosis not present

## 2018-02-28 DIAGNOSIS — M5416 Radiculopathy, lumbar region: Secondary | ICD-10-CM | POA: Diagnosis not present

## 2018-03-14 ENCOUNTER — Encounter: Payer: Self-pay | Admitting: Gastroenterology

## 2018-03-14 ENCOUNTER — Ambulatory Visit: Payer: BLUE CROSS/BLUE SHIELD | Admitting: Gastroenterology

## 2018-03-14 VITALS — BP 98/60 | HR 72 | Ht 65.0 in | Wt 162.0 lb

## 2018-03-14 DIAGNOSIS — K219 Gastro-esophageal reflux disease without esophagitis: Secondary | ICD-10-CM

## 2018-03-14 DIAGNOSIS — K6389 Other specified diseases of intestine: Secondary | ICD-10-CM

## 2018-03-14 DIAGNOSIS — R140 Abdominal distension (gaseous): Secondary | ICD-10-CM | POA: Diagnosis not present

## 2018-03-14 DIAGNOSIS — E739 Lactose intolerance, unspecified: Secondary | ICD-10-CM

## 2018-03-14 DIAGNOSIS — R198 Other specified symptoms and signs involving the digestive system and abdomen: Secondary | ICD-10-CM

## 2018-03-14 MED ORDER — RIFAXIMIN 550 MG PO TABS: 550.0000 mg | ORAL_TABLET | Freq: Three times a day (TID) | ORAL | 0 refills | Status: DC

## 2018-03-14 MED ORDER — ESOMEPRAZOLE MAGNESIUM 40 MG PO CPDR: 40.0000 mg | DELAYED_RELEASE_CAPSULE | Freq: Two times a day (BID) | ORAL | 3 refills | Status: DC

## 2018-03-14 NOTE — Progress Notes (Signed)
Referring Provider: Mikey Kirschner, MD Primary Care Physician:  Mikey Kirschner, MD   Chief complaint:  GERD, IBS   IMPRESSION:  SIBO by breath test 02/07/18 presenting the bloating     - not responding to probiotics    - Increase hydrogen of 21 ppm, increase in methane of 20 ppm, increase in combined H2 and CH4 of 41 ppm.  GERD Alternating diarrhea and constipation diagnosed as IBS by Dr. Allen Norris 2018   - triggered by some breads/yeast rolls. Intolerant to sugar.  Lactose intolerance EGD and Colonoscopy with Dr. Candace Cruise 2016  Reviewed the diagnosis of small bowel bacterial overgrowth.  Given the lack of response to empiric probiotics will attempt a 14-day trial of rifaximin.  Continue Nexium 40 mg BID given the improvement in heartburn.   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  The interval history is obtained through the patient and review of her electronic health record.  She has been taking Nexium 40 mg BID since 01/2018. Finds that her heartburn is better. Epigastric burning has resolved on high dose PPI. Continues to feel GERD in the center of her chest. Feels it 30-40 minutes after eating.  Worse last week. Avoiding caffeine. Some exacerbation with resuming green tea. Not as bad as she decreased her tea frequency.    [employer] daily. Diarrhea and bloating seemed to worsen. She stopped the Align after 2 weeks due to the symptoms. Symptoms recurred but were not as severe.  [facility] hydrogen and methane analysis is consistent with bacterial overgrowth with an increase in hydrogen of 21 ppm, and increase in methane of 20 ppm, and an increase in combined hydrogen and methane of 41 ppm.     Prior studies in Epic: Abdominal ultrasound 01/11/2014 was normal. HIDA scan 01/18/2014 was normal.  Gallbladder ejection fraction was 83%.    Past Medical History:  Diagnosis Date  . Depression   . Desensitization to allergy shot   . GERD (gastroesophageal reflux disease)   . Herpes genitalia   . PONV (postoperative nausea and vomiting)     Past Surgical History:  Procedure Laterality Date  . ABDOMINAL HYSTERECTOMY  2007  . CESAREAN SECTION  4- 2000  . ETHMOIDECTOMY Right 10/21/2016   Procedure: RIGHT TOTAL ETHMOIDECTOMY/ REMOVE ASTEOMA WITHIN RIGHT ETHMOID;  Surgeon: Margaretha Sheffield, MD;  Location: [place];  Service: ENT;  Laterality: Right;  . IMAGE GUIDED SINUS SURGERY N/A 10/21/2016   Procedure: IMAGE GUIDED SINUS SURGERY;  Surgeon: Margaretha Sheffield, MD;  Location: [place];  Service: ENT;  Laterality: N/A;  disc given to [facility] 09/23/16  . MAXILLARY ANTROSTOMY Bilateral 10/21/2016   Procedure: MAXILLARY ANTROSTOMY;  Surgeon: Margaretha Sheffield, MD;  Location: [place];  Service: ENT;  Laterality: Bilateral;    Current Outpatient Medications  Medication Sig Dispense Refill  . acyclovir (ZOVIRAX) 400 MG tablet Take 1 tablet (400 mg total) by mouth 2 (two) times daily. 180 tablet 1  . esomeprazole (NEXIUM) 40 MG capsule Take 1 capsule (40 mg total) by mouth 2 (two) times daily before a meal. 60 capsule 3  . FLUoxetine (PROZAC) 40 MG capsule Take 40 mg by mouth daily.    Marland Kitchen  fluticasone (FLONASE) 50 MCG/ACT nasal spray     . Olopatadine HCl 0.2 % SOLN INSTILL 1 DROP INTO BOTH EYES EVERY DAY  1  . traZODone (DESYREL) 50 MG tablet 50 mg. Takes one to two qhs prn  0  . rifaximin (XIFAXAN) 550 MG TABS tablet Take 1 tablet (550 mg total) by mouth 3 (three) times daily. 42 tablet 0   No current facility-administered medications for this visit.     Allergies as of 03/14/2018 - Review Complete 03/14/2018  Allergen Reaction Noted  . Biaxin [clarithromycin]   07/25/2014    Family History  Problem Relation Age of Onset  . Stroke Mother   . Hypertension Mother   . Heart disease Father   . Hypertension Father   . Ovarian cancer Maternal Grandmother 80  . Breast cancer Neg Hx   . Colon cancer Neg Hx   . Diabetes Neg Hx     Social History   Socioeconomic History  . Marital status: Married    Spouse name: Not on file  . Number of children: 1  . Years of education: Not on file  . Highest education level: Not on file  Occupational History  . Occupation: bussiness Administrator  Social Needs  . Financial resource strain: Not on file  . Food insecurity:    Worry: Not on file    Inability: Not on file  . Transportation needs:    Medical: Not on file    Non-medical: Not on file  Tobacco Use  . Smoking status: Never Smoker  . Smokeless tobacco: Never Used  Substance and Sexual Activity  . Alcohol use: Yes    Alcohol/week: 0.0 standard drinks    Comment: rare consumption  . Drug use: No  . Sexual activity: Yes    Birth control/protection: Surgical  Lifestyle  . Physical activity:    Days per week: 4 days    Minutes per session: 20 min  . Stress: Not on file  Relationships  . Social connections:    Talks on phone: Not on file    Gets together: Not on file    Attends religious service: Not on file    Active member of club or organization: Not on file    Attends meetings of clubs or organizations: Not on file    Relationship status: Not on file  . Intimate partner violence:    Fear of current or ex partner: Not on file    Emotionally abused: Not on file    Physically abused: Not on file    Forced sexual activity: Not on file  Other Topics Concern  . Not on file  Social History Narrative  . Not on file    Review of Systems: 12 system ROS is negative except as noted above.  Filed Weights   03/14/18 1354  Weight: 162 lb (73.5 kg)    Physical Exam: Vital signs were reviewed. General:   Alert, well-nourished, pleasant and  cooperative in NAD Abdomen:  Soft, nontender, normal bowel sounds. No rebound or guarding. No hepatosplenomegaly Neurologic:  Alert and  oriented x4;  grossly nonfocal Skin:  No rash or bruise. Psych:  Alert and cooperative. Normal mood and affect.   Kewon Statler L. Tarri Glenn, MD, MPH [facility] 03/14/2018, 3:51 PM

## 2018-03-14 NOTE — Patient Instructions (Addendum)
We have sent your demographic information and a prescription for Xifaxan to [facility]. This pharmacy is able to get medication approved through insurance and get you the lowest copay possible. If you have not heard from them within 1 week, please call our office at [phone] to let us know.

## 2018-03-16 ENCOUNTER — Telehealth: Payer: Self-pay | Admitting: Gastroenterology

## 2018-03-17 MED ORDER — OMEPRAZOLE 40 MG PO CPDR: 40.0000 mg | DELAYED_RELEASE_CAPSULE | Freq: Two times a day (BID) | ORAL | 3 refills | Status: DC

## 2018-03-17 NOTE — Telephone Encounter (Signed)
Pt informed that insurance will not cover Nexium but will cover omeprazole.  Pt is out of medication.

## 2018-03-17 NOTE — Telephone Encounter (Signed)
Is it ok for patient to take Omeprazole?

## 2018-03-27 DIAGNOSIS — M545 Low back pain: Secondary | ICD-10-CM | POA: Diagnosis not present

## 2018-03-27 DIAGNOSIS — M47816 Spondylosis without myelopathy or radiculopathy, lumbar region: Secondary | ICD-10-CM | POA: Diagnosis not present

## 2018-04-14 ENCOUNTER — Ambulatory Visit: Payer: BLUE CROSS/BLUE SHIELD | Admitting: Gastroenterology

## 2018-04-25 ENCOUNTER — Ambulatory Visit: Payer: BLUE CROSS/BLUE SHIELD | Admitting: Gastroenterology

## 2018-04-25 ENCOUNTER — Encounter: Payer: Self-pay | Admitting: Gastroenterology

## 2018-04-25 VITALS — BP 118/70 | HR 88 | Ht 65.0 in | Wt 162.0 lb

## 2018-04-25 DIAGNOSIS — K6389 Other specified diseases of intestine: Secondary | ICD-10-CM

## 2018-04-25 NOTE — Progress Notes (Signed)
Referring Provider: Mikey Kirschner, MD Primary Care Physician:  Mikey Kirschner, MD   Chief complaint: SIBO, GERD   IMPRESSION:  SIBO by breath test 02/07/18 presenting the bloating     - not responding to probiotics    - Increase hydrogen of 21 ppm, increase in methane of 20 ppm, increase in combined H2 and CH4 of 41 ppm.  GERD, controlled on Nexium 40 mg BID Alternating diarrhea and constipation diagnosed as IBS by Dr. Allen Norris 2018   - triggered by some breads/yeast rolls. Intolerant to sugar.  Lactose intolerance EGD and Colonoscopy with Dr. Candace Cruise 2016  Clinical response to 14-days of rifaximin.  Continue Nexium 40 mg BID given the improvement in heartburn. This may increase her risk for recurrent SIBO.  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 The interval history is obtained through the patient and review of her electronic health record.  Completed 3 weeks of Xifaxan 550 mg TID x 2. Had some GI symptoms a few days later at the same time that her husband had similar symptoms. After 7 days, things have been normal. Nausea and bloating improved. No diarrhea. Continue on Probiotics.  She is currently satisfied with her GI health.   She has been taking Nexium 40 mg BID with control of heartburn.   Prior studies in Epic: Abdominal ultrasound 01/11/2014 was normal. HIDA scan 01/18/2014 was normal.  Gallbladder ejection fraction was 83%.    Past Medical History:  Diagnosis Date  . Depression   . Desensitization to allergy shot   . GERD (gastroesophageal reflux disease)   . Herpes genitalia   . PONV (postoperative nausea and  vomiting)     Past Surgical History:  Procedure Laterality Date  . ABDOMINAL HYSTERECTOMY  2007  . CESAREAN SECTION  4- 2000  . ETHMOIDECTOMY Right 10/21/2016   Procedure: RIGHT TOTAL ETHMOIDECTOMY/ REMOVE ASTEOMA WITHIN RIGHT ETHMOID;  Surgeon: Margaretha Sheffield, MD;  Location: [place];  Service: ENT;  Laterality: Right;  . IMAGE GUIDED SINUS SURGERY N/A 10/21/2016   Procedure: IMAGE GUIDED SINUS SURGERY;  Surgeon: Margaretha Sheffield, MD;  Location: [place];  Service: ENT;  Laterality: N/A;  disc given to [facility] 09/23/16  . MAXILLARY ANTROSTOMY Bilateral 10/21/2016   Procedure: MAXILLARY ANTROSTOMY;  Surgeon: Margaretha Sheffield, MD;  Location: [place];  Service: ENT;  Laterality: Bilateral;    Current Outpatient Medications  Medication Sig Dispense Refill  . acyclovir (ZOVIRAX) 400 MG tablet Take 1 tablet (400 mg total) by mouth 2 (two) times daily. 180 tablet 1  . esomeprazole (NEXIUM) 40 MG capsule Take 1 capsule (40 mg total) by mouth 2 (two) times daily before a meal. 60 capsule 3  . FLUoxetine (PROZAC) 40 MG capsule Take 40 mg by mouth daily.    . fluticasone (FLONASE) 50 MCG/ACT nasal spray     . Olopatadine HCl 0.2 % SOLN INSTILL 1 DROP INTO BOTH EYES EVERY DAY  1  . traZODone (DESYREL) 50 MG tablet 50 mg. Takes one to two qhs prn  0   No current facility-administered medications for this visit.  Allergies as of 04/25/2018 - Review Complete 04/25/2018  Allergen Reaction Noted  . Biaxin [clarithromycin]  07/25/2014    Family History  Problem Relation Age of Onset  . Stroke Mother   . Hypertension Mother   . Heart disease Father   . Hypertension Father   . Ovarian cancer Maternal Grandmother 80  . Breast cancer Neg Hx   . Colon cancer Neg Hx   . Diabetes Neg Hx     Social History   Socioeconomic History  . Marital status: Married    Spouse name: Not on file  . Number of children: 1  . Years of education: Not on file  . Highest education  level: Not on file  Occupational History  . Occupation: bussiness Administrator  Social Needs  . Financial resource strain: Not on file  . Food insecurity:    Worry: Not on file    Inability: Not on file  . Transportation needs:    Medical: Not on file    Non-medical: Not on file  Tobacco Use  . Smoking status: Never Smoker  . Smokeless tobacco: Never Used  Substance and Sexual Activity  . Alcohol use: Yes    Alcohol/week: 0.0 standard drinks    Comment: rare consumption  . Drug use: No  . Sexual activity: Yes    Birth control/protection: Surgical  Lifestyle  . Physical activity:    Days per week: 4 days    Minutes per session: 20 min  . Stress: Not on file  Relationships  . Social connections:    Talks on phone: Not on file    Gets together: Not on file    Attends religious service: Not on file    Active member of club or organization: Not on file    Attends meetings of clubs or organizations: Not on file    Relationship status: Not on file  . Intimate partner violence:    Fear of current or ex partner: Not on file    Emotionally abused: Not on file    Physically abused: Not on file    Forced sexual activity: Not on file  Other Topics Concern  . Not on file  Social History Narrative  . Not on file    Filed Weights   04/25/18 1448  Weight: 162 lb (73.5 kg)    Physical Exam: Vital signs were reviewed. General:   Alert, well-nourished, pleasant and cooperative in NAD Abdomen:  Soft, nontender, normal bowel sounds. No rebound or guarding. No hepatosplenomegaly Neurologic:  Alert and  oriented x4;  grossly nonfocal Skin:  No rash or bruise. Psych:  Alert and cooperative. Normal mood and affect.   Jenniferlynn Saad L. Tarri Glenn, MD, MPH [facility] 04/25/2018, 4:47 PM

## 2018-04-25 NOTE — Patient Instructions (Signed)
Continue to take your probiotics every day.   Please call with any return of symptoms.  I would like for you to return to this office in 4 months if you don't need anything before that time.

## 2018-05-11 DIAGNOSIS — F411 Generalized anxiety disorder: Secondary | ICD-10-CM | POA: Diagnosis not present

## 2018-05-11 DIAGNOSIS — F330 Major depressive disorder, recurrent, mild: Secondary | ICD-10-CM | POA: Diagnosis not present

## 2018-08-31 ENCOUNTER — Encounter: Payer: BLUE CROSS/BLUE SHIELD | Admitting: Obstetrics and Gynecology

## 2018-09-13 DIAGNOSIS — F411 Generalized anxiety disorder: Secondary | ICD-10-CM | POA: Diagnosis not present

## 2018-09-13 DIAGNOSIS — F330 Major depressive disorder, recurrent, mild: Secondary | ICD-10-CM | POA: Diagnosis not present

## 2018-12-05 ENCOUNTER — Other Ambulatory Visit: Payer: Self-pay

## 2018-12-05 ENCOUNTER — Encounter: Payer: Self-pay | Admitting: Certified Nurse Midwife

## 2018-12-05 ENCOUNTER — Ambulatory Visit (INDEPENDENT_AMBULATORY_CARE_PROVIDER_SITE_OTHER): Payer: BC Managed Care – PPO | Admitting: Certified Nurse Midwife

## 2018-12-05 VITALS — BP 150/72 | HR 65 | Ht 65.5 in | Wt 160.4 lb

## 2018-12-05 DIAGNOSIS — Z1231 Encounter for screening mammogram for malignant neoplasm of breast: Secondary | ICD-10-CM

## 2018-12-05 DIAGNOSIS — Z01419 Encounter for gynecological examination (general) (routine) without abnormal findings: Secondary | ICD-10-CM

## 2018-12-05 MED ORDER — ACYCLOVIR 400 MG PO TABS: 400.0000 mg | ORAL_TABLET | Freq: Two times a day (BID) | ORAL | 1 refills | Status: DC

## 2018-12-05 NOTE — Patient Instructions (Signed)

## 2018-12-05 NOTE — Progress Notes (Signed)
GYNECOLOGY ANNUAL PREVENTATIVE CARE ENCOUNTER NOTE  History:     Daisy Soto is a 48 y.o. G67P1001 female here for a routine annual gynecologic exam.  Current complaints: none   Denies abnormal vaginal bleeding, discharge, pelvic pain, problems with intercourse or other gynecologic concerns. She is sexually active with one female partner (husband) denies possibility STD.   Married 1 biological daughter ( live in [place]) 3 step sons Work (at home with covid) insurance   Gynecologic History No LMP recorded. Patient has had a hysterectomy. Contraception: hysterectomy  Last Pap: 2018 ASCUS HPV neg.  Last mammogram: 2019. Results were: normal  Obstetric History OB History  Gravida Para Term Preterm AB Living  1 1 1     1   SAB TAB Ectopic Multiple Live Births          1    # Outcome Date GA Lbr Len/2nd Weight Sex Delivery Anes PTL Lv  1 Term 06/20/98   7 lb (3.175 kg) F CS-Unspec   LIV    Past Medical History:  Diagnosis Date  . Depression   . Desensitization to allergy shot   . GERD (gastroesophageal reflux disease)   . Herpes genitalia   . PONV (postoperative nausea and vomiting)     Past Surgical History:  Procedure Laterality Date  . ABDOMINAL HYSTERECTOMY  2007  . CESAREAN SECTION  4- 2000  . ETHMOIDECTOMY Right 10/21/2016   Procedure: RIGHT TOTAL ETHMOIDECTOMY/ REMOVE ASTEOMA WITHIN RIGHT ETHMOID;  Surgeon: Margaretha Sheffield, MD;  Location: [place];  Service: ENT;  Laterality: Right;  . IMAGE GUIDED SINUS SURGERY N/A 10/21/2016   Procedure: IMAGE GUIDED SINUS SURGERY;  Surgeon: Margaretha Sheffield, MD;  Location: [place];  Service: ENT;  Laterality: N/A;  disc given to [facility] 09/23/16  . MAXILLARY ANTROSTOMY Bilateral 10/21/2016   Procedure: MAXILLARY ANTROSTOMY;  Surgeon: Margaretha Sheffield, MD;  Location: [place];  Service: ENT;  Laterality: Bilateral;    Current Outpatient Medications on File Prior to Visit  Medication Sig Dispense Refill   . acyclovir (ZOVIRAX) 400 MG tablet Take 1 tablet (400 mg total) by mouth 2 (two) times daily. 180 tablet 1  . esomeprazole (NEXIUM) 40 MG capsule Take 1 capsule (40 mg total) by mouth 2 (two) times daily before a meal. 60 capsule 3  . FLUoxetine (PROZAC) 40 MG capsule Take 40 mg by mouth daily.    . fluticasone (FLONASE) 50 MCG/ACT nasal spray     . traZODone (DESYREL) 50 MG tablet 50 mg. Takes one to two qhs prn  0  . Olopatadine HCl 0.2 % SOLN INSTILL 1 DROP INTO BOTH EYES EVERY DAY  1   No current facility-administered medications on file prior to visit.     Allergies  Allergen Reactions  . Biaxin [Clarithromycin]     Makes stomach burn    Social History:  reports that she has never smoked. She has never used smokeless tobacco. She reports current alcohol use. She reports that she does not use drugs.  Family History  Problem Relation Age of Onset  . Stroke Mother   . Hypertension Mother   . Heart disease Father   . Hypertension Father   . Ovarian cancer Maternal Grandmother 80  . Breast cancer Neg Hx   . Colon cancer Neg Hx   . Diabetes Neg Hx     The following portions of the patient's history were reviewed and updated as appropriate: allergies, current medications, past family history, past  medical history, past social history, past surgical history and problem list.  Review of Systems Pertinent items noted in HPI and remainder of comprehensive ROS otherwise negative.  Exercises daily walking. Denies smoking, drinking, drugs  Physical Exam:  BP (!) 150/72   Pulse 65   Ht 5' 5.5" (1.664 m)   Wt 160 lb 7 oz (72.8 kg)   BMI 26.29 kg/m  CONSTITUTIONAL: Well-developed, well-nourished female in no acute distress.  HENT:  Normocephalic, atraumatic, External right and left ear normal. Oropharynx is clear and moist EYES: Conjunctivae and EOM are normal. Pupils are equal, round, and reactive to light. No scleral icterus.  NECK: Normal range of motion, supple, no masses.   Normal thyroid.  SKIN: Skin is warm and dry. No rash noted. Not diaphoretic. No erythema. No pallor. MUSCULOSKELETAL: Normal range of motion. No tenderness.  No cyanosis, clubbing, or edema.  2+ distal pulses. NEUROLOGIC: Alert and oriented to person, place, and time. Normal reflexes, muscle tone coordination. No cranial nerve deficit noted. PSYCHIATRIC: Normal mood and affect. Normal behavior. Normal judgment and thought content. CARDIOVASCULAR: Normal heart rate noted, regular rhythm RESPIRATORY: Clear to auscultation bilaterally. Effort and breath sounds normal, no problems with respiration noted. BREASTS: Symmetric in size. No masses, skin changes, nipple drainage, or lymphadenopathy. ABDOMEN: Soft, normal bowel sounds, no distention noted.  No tenderness, rebound or guarding.  PELVIC: Normal appearing external genitalia; normal appearing vaginal mucosa normal atrophy.  No abnormal discharge noted. No cervix or uterus. Pt state she has left ovary. no other palpable masses, adnexal tenderness.   Assessment and Plan:  Annual Well Women GYN Exam Pap not indicated Mammogram scheduled Pt complains of vaginal dryness. Discussed normal estrogen changes . Recommend vaginal moisturizer and lubrication with  Intercourse.  Labs completed through work wellness plan  Refill placed on valtrex Routine preventative health maintenance measures emphasized. Please refer to After Visit Summary for other counseling recommendations.     Philip Aspen, CNM

## 2018-12-22 DIAGNOSIS — L2084 Intrinsic (allergic) eczema: Secondary | ICD-10-CM | POA: Diagnosis not present

## 2018-12-22 DIAGNOSIS — D485 Neoplasm of uncertain behavior of skin: Secondary | ICD-10-CM | POA: Diagnosis not present

## 2018-12-22 DIAGNOSIS — L570 Actinic keratosis: Secondary | ICD-10-CM | POA: Diagnosis not present

## 2018-12-22 DIAGNOSIS — X32XXXA Exposure to sunlight, initial encounter: Secondary | ICD-10-CM | POA: Diagnosis not present

## 2018-12-22 DIAGNOSIS — L814 Other melanin hyperpigmentation: Secondary | ICD-10-CM | POA: Diagnosis not present

## 2019-01-10 ENCOUNTER — Telehealth: Payer: Self-pay | Admitting: Gastroenterology

## 2019-01-10 MED ORDER — ESOMEPRAZOLE MAGNESIUM 40 MG PO CPDR: 40.0000 mg | DELAYED_RELEASE_CAPSULE | Freq: Two times a day (BID) | ORAL | 0 refills | Status: DC

## 2019-01-10 NOTE — Telephone Encounter (Signed)
Rx sent to pharmacy, pt encouraged to make a follow appointment.

## 2019-01-15 DIAGNOSIS — F330 Major depressive disorder, recurrent, mild: Secondary | ICD-10-CM | POA: Diagnosis not present

## 2019-01-15 DIAGNOSIS — F411 Generalized anxiety disorder: Secondary | ICD-10-CM | POA: Diagnosis not present

## 2019-02-01 DIAGNOSIS — J309 Allergic rhinitis, unspecified: Secondary | ICD-10-CM | POA: Diagnosis not present

## 2019-02-13 ENCOUNTER — Other Ambulatory Visit: Payer: Self-pay

## 2019-02-13 ENCOUNTER — Ambulatory Visit: Payer: BC Managed Care – PPO | Admitting: Gastroenterology

## 2019-02-13 ENCOUNTER — Encounter: Payer: Self-pay | Admitting: Gastroenterology

## 2019-02-13 VITALS — BP 128/72 | HR 72 | Temp 97.9°F | Ht 65.0 in | Wt 162.6 lb

## 2019-02-13 DIAGNOSIS — R198 Other specified symptoms and signs involving the digestive system and abdomen: Secondary | ICD-10-CM | POA: Diagnosis not present

## 2019-02-13 DIAGNOSIS — K219 Gastro-esophageal reflux disease without esophagitis: Secondary | ICD-10-CM | POA: Diagnosis not present

## 2019-02-13 DIAGNOSIS — Z1159 Encounter for screening for other viral diseases: Secondary | ICD-10-CM

## 2019-02-13 DIAGNOSIS — R140 Abdominal distension (gaseous): Secondary | ICD-10-CM

## 2019-02-13 MED ORDER — FAMOTIDINE 20 MG PO TABS: 20.0000 mg | ORAL_TABLET | Freq: Two times a day (BID) | ORAL | 3 refills | Status: DC

## 2019-02-13 NOTE — Progress Notes (Signed)
Referring Provider: Mikey Kirschner, MD Primary Care Physician:  Mikey Kirschner, MD   Chief complaint: Epigastric pain and heartburn   IMPRESSION:  GERD +/- dyspepsia, inadequately controlled on Nexium 40 mg BID SIBO by breath test 02/07/18 presenting the bloating     - not responding to probiotics    - Increase hydrogen of 21 ppm, increase in methane of 20 ppm, increase in combined H2 and CH4 of 41 ppm.  History of H. pylori negative gastritis on EGD 08/22/2012 Alternating diarrhea and constipation diagnosed as IBS by Dr. Allen Norris 2018   - triggered by some breads/yeast rolls. Intolerant to sugar.  Lactose intolerance EGD and Colonoscopy with Dr. Candace Cruise 2014 Screening colonoscopy due 2024  Bloating responded to 14-days of rifaximin.  However, she continues to have reflux despite Nexium 40 mg twice daily. Endoscopic evaluation in 2014 did not include any esophageal biopsies.  We will work to maximize medical therapy and proceed with EGD with esophageal biopsies to exclude eosinophilic esophagitis.  PLAN: Continue Nexium 40 mg BID Add famotidine 20 mg BID for breakthrough symptoms   EGD with esophageal biopsies to exclude eosinophilic esophagitis  The nature of the procedure, as well as the risks, benefits, and alternatives were carefully and thoroughly reviewed with the patient. Ample time for discussion and questions allowed. The patient understood, was satisfied, and agreed to proceed.  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 She was last seen 04/25/2018.  The interval history is obtained through the patient and review of her electronic health record.  Completed 3 weeks of Xifaxan 550 mg TID x 2 in December 2019 with improvement in nausea and bloating. Continued on Probiotics.  Was feeling better at the time of her last visit 04/25/2018.  Returns now with ongoing, nonradiating epigastric pain and heartburn despite Nexium 40 mg twice  daily. Green tea has been identified as a trigger.  Nausea controlled with ginger ale.  Less bloating and abdominal distension since completing the Xifaxan Feels there is occasionally a twist where the esophagus meets the stomach Using her probiotics QOD. Will experience constipation if using it daily. Has diarrhea if she doesn't use it. Last bad flare occurred over Thanksgiving.  Good appetite. No early satiety. Eats small portions throughout the day.  Lost weight this summer but has slowly regained the weight she had lost.   No GI bleeding, iron deficiency anemia, anorexia, unexplained weight loss, dysphagia, odynophagia, dysphonia, sore throat, neck pain, or persistent vomiting.  Today we have been able to obtain and review a copy of her EGD and colonoscopy performed by Dr. Verdie Shire 08/23/22 for evaluation of hematochezia, epigastric pain, and dyspepsia.  The EGD was normal.  Gastric biopsies showed chronic gastritis with no H. pylori or dysplasia.  No esophageal biopsies were obtained.  Colonoscopy revealed mild diffuse melanosis.  Repeat colonoscopy recommended in 10 years.  Prior studies in Epic: Abdominal ultrasound 01/11/2014 was normal. HIDA scan 01/18/2014 was normal.  Gallbladder ejection fraction was 83%.  Endoscopic evaluation: EGD 08/22/12 with Dr. Candace Cruise: Endoscopically normal; chronic gastritis without H pylori Colonoscopy 08/22/12 with Dr. Candace Cruise: melanosis coli, repeat exam recommended in 10 years  Past Medical History:  Diagnosis Date  . Depression   . Desensitization to allergy shot   . GERD (gastroesophageal reflux disease)   . Herpes genitalia   . PONV (postoperative nausea and vomiting)     Past Surgical History:  Procedure Laterality Date  . ABDOMINAL HYSTERECTOMY  2007  . CESAREAN SECTION  4- 2000  . ETHMOIDECTOMY Right 10/21/2016   Procedure: RIGHT TOTAL ETHMOIDECTOMY/ REMOVE ASTEOMA WITHIN RIGHT ETHMOID;  Surgeon: Margaretha Sheffield, MD;  Location: [place];   Service: ENT;  Laterality: Right;  . IMAGE GUIDED SINUS SURGERY N/A 10/21/2016   Procedure: IMAGE GUIDED SINUS SURGERY;  Surgeon: Margaretha Sheffield, MD;  Location: [place];  Service: ENT;  Laterality: N/A;  disc given to [facility] 09/23/16  . MAXILLARY ANTROSTOMY Bilateral 10/21/2016   Procedure: MAXILLARY ANTROSTOMY;  Surgeon: Margaretha Sheffield, MD;  Location: [place];  Service: ENT;  Laterality: Bilateral;    Current Outpatient Medications  Medication Sig Dispense Refill  . acyclovir (ZOVIRAX) 400 MG tablet Take 1 tablet (400 mg total) by mouth 2 (two) times daily. 180 tablet 1  . esomeprazole (NEXIUM) 40 MG capsule Take 1 capsule (40 mg total) by mouth 2 (two) times daily before a meal. 60 capsule 0  . FLUoxetine (PROZAC) 40 MG capsule Take 40 mg by mouth daily.    . fluticasone (FLONASE) 50 MCG/ACT nasal spray     . traZODone (DESYREL) 50 MG tablet 50 mg. Takes one to two qhs prn  0   No current facility-administered medications for this visit.    Allergies as of 02/13/2019 - Review Complete 02/13/2019  Allergen Reaction Noted  . Biaxin [clarithromycin]  07/25/2014    Family History  Problem Relation Age of Onset  . Stroke Mother   . Hypertension Mother   . Heart disease Father   . Hypertension Father   . Ovarian cancer Maternal Grandmother 80  . Breast cancer Neg Hx   . Colon cancer Neg Hx   . Diabetes Neg Hx     Social History   Socioeconomic History  . Marital status: Married    Spouse name: Not on file  . Number of children: 1  . Years of education: Not on file  . Highest education level: Not on file  Occupational History  . Occupation: bussiness Administrator  Tobacco Use  . Smoking status: Never Smoker  . Smokeless tobacco: Never Used  Substance and Sexual Activity  . Alcohol use: Yes    Alcohol/week: 0.0 standard drinks    Comment: rare consumption  . Drug use: No  . Sexual activity: Yes    Birth control/protection: Surgical  Other Topics Concern   . Not on file  Social History Narrative  . Not on file   Social Determinants of Health   Financial Resource Strain:   . Difficulty of Paying Living Expenses: Not on file  Food Insecurity:   . Worried About Charity fundraiser in the Last Year: Not on file  . Ran Out of Food in the Last Year: Not on file  Transportation Needs:   . Lack of Transportation (Medical): Not on file  . Lack of Transportation (Non-Medical): Not on file  Physical Activity:   . Days of Exercise per Week: Not on file  . Minutes of Exercise per Session: Not on file  Stress:   . Feeling of Stress : Not on file  Social Connections:   . Frequency of Communication with Friends and Family: Not on file  . Frequency of Social Gatherings with Friends and Family: Not on file  . Attends Religious Services: Not on file  . Active Member of Clubs or Organizations: Not on file  . Attends Archivist Meetings: Not on file  . Marital Status: Not on file  Intimate Partner  Violence:   . Fear of Current or Ex-Partner: Not on file  . Emotionally Abused: Not on file  . Physically Abused: Not on file  . Sexually Abused: Not on file    [facility] Weights   02/13/19 0853  Weight: 162 lb 9.6 oz (73.8 kg)    Physical Exam: Vital signs were reviewed. General:   Alert, well-nourished, pleasant and cooperative in NAD Abdomen:  Soft, nontender, normal bowel sounds. No rebound or guarding. No hepatosplenomegaly Neurologic:  Alert and  oriented x4;  grossly nonfocal Skin:  No rash or bruise. Psych:  Alert and cooperative. Normal mood and affect.   Daisy Soto L. Tarri Glenn, MD, MPH [facility] 02/13/2019, 8:58 AM

## 2019-02-13 NOTE — Patient Instructions (Signed)
We have sent the following medications to your pharmacy for you to pick up at your convenience:  Continue Nexium 40 mg twice a day   Add Famotidine 20 mg twice a day   You have been scheduled for an endoscopy. Please follow written instructions given to you at your visit today. If you use inhalers (even only as needed), please bring them with you on the day of your procedure.

## 2019-02-19 ENCOUNTER — Other Ambulatory Visit: Payer: Self-pay | Admitting: Gastroenterology

## 2019-02-19 MED ORDER — ESOMEPRAZOLE MAGNESIUM 40 MG PO CPDR: 40.0000 mg | DELAYED_RELEASE_CAPSULE | Freq: Two times a day (BID) | ORAL | 3 refills | Status: DC

## 2019-02-19 NOTE — Telephone Encounter (Signed)
Nexium refilled as requested.

## 2019-02-21 ENCOUNTER — Encounter: Payer: Self-pay | Admitting: Gastroenterology

## 2019-02-21 ENCOUNTER — Ambulatory Visit (INDEPENDENT_AMBULATORY_CARE_PROVIDER_SITE_OTHER): Payer: BC Managed Care – PPO

## 2019-02-21 ENCOUNTER — Other Ambulatory Visit: Payer: Self-pay | Admitting: Gastroenterology

## 2019-02-21 DIAGNOSIS — Z1159 Encounter for screening for other viral diseases: Secondary | ICD-10-CM | POA: Diagnosis not present

## 2019-02-22 ENCOUNTER — Telehealth: Payer: Self-pay | Admitting: Gastroenterology

## 2019-02-22 ENCOUNTER — Telehealth: Payer: Self-pay | Admitting: Family Medicine

## 2019-02-22 DIAGNOSIS — U071 COVID-19: Secondary | ICD-10-CM

## 2019-02-22 HISTORY — DX: COVID-19: U07.1

## 2019-02-22 LAB — SARS CORONAVIRUS 2 (TAT 6-24 HRS): SARS Coronavirus 2: POSITIVE — AB

## 2019-02-22 NOTE — Telephone Encounter (Signed)
Definitely not a problem, thanks!

## 2019-02-22 NOTE — Telephone Encounter (Signed)
Patient states she has no symptoms and feels completely fine but is currently cancelling all of their christmas plans and will quarantine as directed. Patient advised of warning signs and verbalized understanding. Patient declines virtual visit at this time. Questions answered.

## 2019-02-22 NOTE — Telephone Encounter (Signed)
Dr. Wolfgang Phoenix, I created the phone note encounter after the Lab called me this morning. Sorry for the confusion.

## 2019-02-22 NOTE — Telephone Encounter (Signed)
Pt had rapid test yesterday for a prescreen test for a procedure and it came back positive. The ENDO office told her to follow up with her PCP. Is there anything she needs to be doing?

## 2019-02-22 NOTE — Telephone Encounter (Signed)
Please reschedule at her convenience per postCovid protocol. Thanks.

## 2019-02-22 NOTE — Telephone Encounter (Signed)
I don't think i've ever seen a notification come thru "patient calls"to [place] without the patient generating the phone call. This looks more like a "cc". The trouble is, I do not know whether the specialist told pt they would forward this to [place] as a phone message so call pt and clarify her situatio, should qarantine ten d from symtoms. Husband if symtomatic should also quarantine and consider testing but even if his testing neg should assume he has it. Send response to dr scott I will not be cking pone messages later today

## 2019-02-22 NOTE — Telephone Encounter (Signed)
Patient contacted.  She will call back to reschedule at her convenience.  She wants to speak with her PCP first.

## 2019-02-22 NOTE — Telephone Encounter (Signed)
If she is doing fine the treatment for this is home care and self quarantine for 10 days Family members in the household should consider to get tested over the course of the next several days plus also they should try to stay apart from each other wearing mask and household family members directly exposed to [place] should self quarantine for at least 14 days  Please educate her regarding warning signs-if having significant shortness of breath difficulty breathing passing out etc. go to the ER Certainly more information can be sent via [place] Also if patient desires a virtual visit we can do so  If she needs a work note we can provide that as well

## 2019-02-22 NOTE — Telephone Encounter (Signed)
Patient called office this morning and message was sent to Dr Nicki Reaper-  advice given to patient and patient verbalized understanding.

## 2019-02-22 NOTE — Telephone Encounter (Signed)
Her preLEC covid test came back positive this morning. Drawn yesterday for EGD on this coming Tuesday.  She had URI symptoms a few days ago, assumed it was a 'cold.'  Feeling better lately but still sounded pretty hoarse on the phone.  Her husband has started to develop the same URI symptoms in past couple days.   . This test result was explained to them. . They are aware that their upcoming Mariano Colon endoscopic procedure will be cancelled. . They were advised to isolate themselves for at least 10 days from the date of this positive test. . They were instructed to contact their PCP for further COVID-19 related recommendations. . This communication will be sent to the patient's primary [facility] and to their PCP.

## 2019-02-27 ENCOUNTER — Encounter: Payer: BC Managed Care – PPO | Admitting: Gastroenterology

## 2019-03-06 DIAGNOSIS — Z20828 Contact with and (suspected) exposure to other viral communicable diseases: Secondary | ICD-10-CM | POA: Diagnosis not present

## 2019-03-14 ENCOUNTER — Ambulatory Visit
Admission: RE | Admit: 2019-03-14 | Discharge: 2019-03-14 | Disposition: A | Discharge status: Home / Self Care | Payer: BC Managed Care – PPO | Source: Ambulatory Visit | Attending: Certified Nurse Midwife | Admitting: Certified Nurse Midwife

## 2019-03-14 DIAGNOSIS — Z1231 Encounter for screening mammogram for malignant neoplasm of breast: Secondary | ICD-10-CM

## 2019-03-20 ENCOUNTER — Ambulatory Visit (INDEPENDENT_AMBULATORY_CARE_PROVIDER_SITE_OTHER): Payer: Self-pay | Admitting: Family Medicine

## 2019-03-20 ENCOUNTER — Other Ambulatory Visit: Payer: Self-pay

## 2019-03-20 DIAGNOSIS — R0609 Other forms of dyspnea: Secondary | ICD-10-CM

## 2019-03-20 DIAGNOSIS — R0600 Dyspnea, unspecified: Secondary | ICD-10-CM

## 2019-03-20 NOTE — Progress Notes (Signed)
   Subjective:    Patient ID: Daisy Soto, female    DOB: [birth year], 49 y.o.   MRN: [id number]  Diagnosed with covid on dec 24th. Pt states since then she gets winded and tightness in chest when trying to walk. Having a slight cough.  Patient relates that she had minimal symptoms with Covid but then recently she has noticed that when she moves around she gets tired she gets short of breath but if she does mild activity in the house she does not have any problems but if she tries to go for walks that is when she experiences her trouble she denies any PND denies orthopnea denies hemoptysis.  She states her energy level overall is decent except for when she tries to get going she states is been going on for over a week and so therefore she thought she get checked out she states she has intermittent chest tightness when she walks intermittent chest pains but these are infrequent.  Denies high fever and chills denies sweats denies weight loss states appetite good she states overall she feels good. Virtual Visit via Telephone Note  I connected with Daisy Soto on 03/20/19 at  3:50 PM EST by telephone and verified that I am speaking with the correct person using two identifiers.  Location: Patient: home Provider: office   I discussed the limitations, risks, security and privacy concerns of performing an evaluation and management service by telephone and the availability of in person appointments. I also discussed with the patient that there may be a patient responsible charge related to this service. The patient expressed understanding and agreed to proceed.   History of Present Illness:    Observations/Objective:   Assessment and Plan:   Follow Up Instructions:    I discussed the assessment and treatment plan with the patient. The patient was provided an opportunity to ask questions and all were answered. The patient agreed with the plan and demonstrated an understanding of the  instructions.   The patient was advised to call back or seek an in-person evaluation if the symptoms worsen or if the condition fails to improve as anticipated.  I provided 15 minutes of non-face-to-face time during this encounter.       Objective:   Physical Exam  Today's visit was via telephone Physical exam was not possible for this visit  Patient was offered to be seen today but because it was late in the afternoon and she lives in [place] she states she could not make it today so therefore we will set her up for the morning      Assessment & Plan:  Post Covid infection Shortness of breath with activity Will need to have some additional studies done.  Patient was told that if she gets worse tonight with chest tightness pressure pain shortness of breath she needs to go to the emergency department Otherwise we will evaluate her in the morning More than likely due to chest x-ray, EKG, D-dimer May or may not need CT scan of chest May or may not need echo

## 2019-03-21 ENCOUNTER — Encounter: Payer: Self-pay | Admitting: Family Medicine

## 2019-03-21 ENCOUNTER — Other Ambulatory Visit (HOSPITAL_COMMUNITY)
Admission: RE | Admit: 2019-03-21 | Discharge: 2019-03-21 | Disposition: A | Discharge status: Home / Self Care | Payer: BC Managed Care – PPO | Source: Ambulatory Visit | Attending: Family Medicine | Admitting: Family Medicine

## 2019-03-21 ENCOUNTER — Ambulatory Visit (HOSPITAL_COMMUNITY)
Admission: RE | Admit: 2019-03-21 | Discharge: 2019-03-21 | Disposition: A | Discharge status: Home / Self Care | Payer: BC Managed Care – PPO | Source: Ambulatory Visit | Attending: Family Medicine | Admitting: Family Medicine

## 2019-03-21 ENCOUNTER — Ambulatory Visit: Payer: BC Managed Care – PPO | Admitting: Family Medicine

## 2019-03-21 VITALS — BP 124/76 | Temp 97.7°F | Ht 65.0 in | Wt 163.0 lb

## 2019-03-21 DIAGNOSIS — R079 Chest pain, unspecified: Secondary | ICD-10-CM | POA: Diagnosis not present

## 2019-03-21 DIAGNOSIS — Z8616 Personal history of COVID-19: Secondary | ICD-10-CM | POA: Diagnosis not present

## 2019-03-21 DIAGNOSIS — R0600 Dyspnea, unspecified: Secondary | ICD-10-CM

## 2019-03-21 DIAGNOSIS — R0602 Shortness of breath: Secondary | ICD-10-CM | POA: Diagnosis not present

## 2019-03-21 DIAGNOSIS — R0609 Other forms of dyspnea: Secondary | ICD-10-CM

## 2019-03-21 LAB — BASIC METABOLIC PANEL
Anion gap: 12 (ref 5–15)
BUN: 15 mg/dL (ref 6–20)
CO2: 26 mmol/L (ref 22–32)
Calcium: 9.1 mg/dL (ref 8.9–10.3)
Chloride: 99 mmol/L (ref 98–111)
Creatinine, Ser: 0.63 mg/dL (ref 0.44–1.00)
GFR calc Af Amer: >60 mL/min (ref >60)
GFR calc non Af Amer: >60 mL/min (ref >60)
Glucose, Bld: 96 mg/dL (ref 70–99)
Potassium: 3.8 mmol/L (ref 3.5–5.1)
Sodium: 137 mmol/L (ref 135–145)

## 2019-03-21 LAB — HEPATIC FUNCTION PANEL
ALT: 24 U/L (ref 0–44)
AST: 24 U/L (ref 15–41)
Albumin: 4.3 g/dL (ref 3.5–5.0)
Alkaline Phosphatase: 73 U/L (ref 38–126)
Bilirubin, Direct: 0.1 mg/dL (ref 0.0–0.2)
Indirect Bilirubin: 0.4 mg/dL (ref 0.3–0.9)
Total Bilirubin: 0.5 mg/dL (ref 0.3–1.2)
Total Protein: 8 g/dL (ref 6.5–8.1)

## 2019-03-21 LAB — C-REACTIVE PROTEIN: CRP: 0.6 mg/dL (ref <1.0)

## 2019-03-21 LAB — TROPONIN I (HIGH SENSITIVITY): Troponin I (High Sensitivity): <2 ng/L (ref <18)

## 2019-03-21 LAB — D-DIMER, QUANTITATIVE: D-Dimer, Quant: 0.48 ug/mL-FEU (ref 0.00–0.50)

## 2019-03-21 NOTE — Progress Notes (Signed)
   Subjective:    Patient ID: Daisy Soto, female    DOB: [birth year], 49 y.o.   MRN: [id number]  HPIpt was positive for covid on dec 24th and still having chest tightness and if she does any kind of physical activity she gets short winded.  This patient had onset of Covid right before Christmas she was asymptomatic and then later developed a low-grade fever and cough but that went away she states she has had some ongoing fatigue symptoms and then recently she is noticed getting short of breath with activity and unable to do normal walking that she normally is able to do along with some chest tightness and intermittent chest pain she denies ongoing fever sweats chills weight loss.  States her appetite is doing okay she also relates her mentation is doing fine  PMH benign Review of Systems  Constitutional: Negative for activity change, appetite change and fatigue.  HENT: Negative for congestion and rhinorrhea.   Respiratory: Positive for chest tightness and shortness of breath. Negative for cough.   Cardiovascular: Negative for chest pain and leg swelling.  Gastrointestinal: Negative for abdominal pain and diarrhea.  Endocrine: Negative for polydipsia and polyphagia.  Skin: Negative for color change.  Neurological: Negative for dizziness and weakness.  Psychiatric/Behavioral: Negative for behavioral problems and confusion.       Objective:   Physical Exam Vitals reviewed.  Constitutional:      General: She is not in acute distress. HENT:     Head: Normocephalic and atraumatic.  Eyes:     General:        Right eye: No discharge.        Left eye: No discharge.  Neck:     Trachea: No tracheal deviation.  Cardiovascular:     Rate and Rhythm: Normal rate and regular rhythm.     Heart sounds: Normal heart sounds. No murmur.  Pulmonary:     Effort: Pulmonary effort is normal. No respiratory distress.     Breath sounds: Normal breath sounds.  Lymphadenopathy:     Cervical: No  cervical adenopathy.  Skin:    General: Skin is warm and dry.  Neurological:     Mental Status: She is alert.     Coordination: Coordination normal.  Psychiatric:        Behavior: Behavior normal.   Her lungs are very clear with no crackles or wheezes heart is regular EKG no acute changes normal sinus rhythm no ST segment changes No change versus previous      Assessment & Plan:  Chest pain with shortness of breath with recent history of Covid this needs further looking into.  We will do D-dimer along with troponin in addition to this also do chest x-ray may need further studies based upon the findings  Should be noted that chest x-ray look good D-dimer came back normal troponin came back normal because of her shortness of breath with activity we will do echo to make sure there is not a cardiomyopathy associated with her recent symptoms that have come on since Covid patient understands we will move forward with echo

## 2019-03-22 ENCOUNTER — Encounter: Payer: Self-pay | Admitting: Family Medicine

## 2019-03-22 ENCOUNTER — Other Ambulatory Visit: Payer: Self-pay | Admitting: *Deleted

## 2019-03-22 DIAGNOSIS — R0600 Dyspnea, unspecified: Secondary | ICD-10-CM

## 2019-03-22 DIAGNOSIS — Z8616 Personal history of COVID-19: Secondary | ICD-10-CM

## 2019-03-22 DIAGNOSIS — R0609 Other forms of dyspnea: Secondary | ICD-10-CM

## 2019-03-28 ENCOUNTER — Other Ambulatory Visit: Payer: Self-pay | Admitting: Family Medicine

## 2019-03-28 ENCOUNTER — Other Ambulatory Visit: Payer: Self-pay

## 2019-03-28 ENCOUNTER — Ambulatory Visit (HOSPITAL_COMMUNITY)
Admission: RE | Admit: 2019-03-28 | Discharge: 2019-03-28 | Disposition: A | Discharge status: Home / Self Care | Payer: BC Managed Care – PPO | Source: Ambulatory Visit | Attending: Family Medicine | Admitting: Family Medicine

## 2019-03-28 DIAGNOSIS — K7689 Other specified diseases of liver: Secondary | ICD-10-CM

## 2019-03-28 DIAGNOSIS — Z8616 Personal history of COVID-19: Secondary | ICD-10-CM

## 2019-03-28 DIAGNOSIS — R0600 Dyspnea, unspecified: Secondary | ICD-10-CM

## 2019-03-28 NOTE — Progress Notes (Signed)
*  PRELIMINARY RESULTS* Echocardiogram 2D Echocardiogram has been performed.  Daisy Soto 03/28/2019, 10:18 AM

## 2019-04-02 ENCOUNTER — Ambulatory Visit (HOSPITAL_COMMUNITY)
Admission: RE | Admit: 2019-04-02 | Discharge: 2019-04-02 | Disposition: A | Discharge status: Home / Self Care | Payer: BC Managed Care – PPO | Source: Ambulatory Visit | Attending: Family Medicine | Admitting: Family Medicine

## 2019-04-02 ENCOUNTER — Other Ambulatory Visit: Payer: Self-pay

## 2019-04-02 DIAGNOSIS — K7689 Other specified diseases of liver: Secondary | ICD-10-CM | POA: Insufficient documentation

## 2019-04-04 ENCOUNTER — Encounter: Payer: Self-pay | Admitting: Family Medicine

## 2019-04-05 ENCOUNTER — Encounter: Payer: Self-pay | Admitting: Gastroenterology

## 2019-04-05 NOTE — Telephone Encounter (Signed)
OPENED IN ERROR

## 2019-04-10 ENCOUNTER — Encounter: Payer: BC Managed Care – PPO | Admitting: Gastroenterology

## 2019-04-11 ENCOUNTER — Encounter: Payer: Self-pay | Admitting: Gastroenterology

## 2019-04-11 ENCOUNTER — Ambulatory Visit (AMBULATORY_SURGERY_CENTER): Payer: BC Managed Care – PPO | Admitting: Gastroenterology

## 2019-04-11 ENCOUNTER — Other Ambulatory Visit: Payer: Self-pay

## 2019-04-11 VITALS — BP 117/60 | HR 63 | Temp 96.9°F | Resp 11 | Ht 65.0 in | Wt 162.0 lb

## 2019-04-11 DIAGNOSIS — K2950 Unspecified chronic gastritis without bleeding: Secondary | ICD-10-CM | POA: Diagnosis not present

## 2019-04-11 DIAGNOSIS — K3189 Other diseases of stomach and duodenum: Secondary | ICD-10-CM | POA: Diagnosis not present

## 2019-04-11 DIAGNOSIS — K317 Polyp of stomach and duodenum: Secondary | ICD-10-CM | POA: Diagnosis not present

## 2019-04-11 DIAGNOSIS — K219 Gastro-esophageal reflux disease without esophagitis: Secondary | ICD-10-CM

## 2019-04-11 DIAGNOSIS — K2970 Gastritis, unspecified, without bleeding: Secondary | ICD-10-CM

## 2019-04-11 DIAGNOSIS — R1013 Epigastric pain: Secondary | ICD-10-CM

## 2019-04-11 MED ORDER — SODIUM CHLORIDE 0.9 % IV SOLN: 500.0000 mL | Freq: Once | INTRAVENOUS | Status: DC

## 2019-04-11 NOTE — Progress Notes (Signed)
Temp by Ledora Bottcher by KA

## 2019-04-11 NOTE — Progress Notes (Signed)
PT taken to PACU. Monitors in place. VSS. Report given to RN. 

## 2019-04-11 NOTE — Op Note (Signed)
[facility] Patient Name: Daisy Soto Procedure Date: 04/11/2019 2:36 PM MRN: [id number] Endoscopist: Thornton Park MD, MD Age: 49 Referring MD:  Date of Birth: [birth year] Gender: Female Account #: [id number] Procedure:                Upper GI endoscopy Indications:              Dyspepsia                           GERD +/- dyspepsiadyspepsia, inadequately                            controlled on Nexium 40 mg BID                           SIBO by breath test 02/07/18 presenting the bloating                           - not responding to probiotics                           - Increase hydrogen of 21 ppm, increase in methane                            of 20 ppm, increase in combined H2 and CH4 of 41                            ppm.                           History of H. pylori negative gastritis on EGD                            08/22/2012                           Alternating diarrhea and constipation diagnosed as                            IBS by Dr. Allen Norris 2018                           - triggered by some breads/yeast rolls. Intolerant                            to sugar                           Lactose intolerance Medicines:                Monitored Anesthesia Care Procedure:                Pre-Anesthesia Assessment:                           - Prior to the procedure, a History and Physical  was performed, and patient medications and                            allergies were reviewed. The patient's tolerance of                            previous anesthesia was also reviewed. The risks                            and benefits of the procedure and the sedation                            options and risks were discussed with the patient.                            All questions were answered, and informed consent                            was obtained. Prior Anticoagulants: The patient has                            taken no previous  anticoagulant or antiplatelet                            agents. ASA Grade Assessment: II - A patient with                            mild systemic disease. After reviewing the risks                            and benefits, the patient was deemed in                            satisfactory condition to undergo the procedure.                           After obtaining informed consent, the endoscope was                            passed under direct vision. Throughout the                            procedure, the patient's blood pressure, pulse, and                            oxygen saturations were monitored continuously. The                            Endoscope was introduced through the mouth, and                            advanced to the third part of duodenum. The upper  GI endoscopy was accomplished without difficulty.                            The patient tolerated the procedure well. Scope In: Scope Out: Findings:                 The examined esophagus was normal. Biopsies were                            obtained from the proximal and distal esophagus                            with cold forceps for histology of suspected                            eosinophilic esophagitis.                           Diffuse mild inflammation characterized by                            erythema, friability and granularity was found in                            the gastric body. Biopsies were taken from the                            antrum, body, and fundus with a cold forceps for                            histology. Estimated blood loss was minimal.                           Multiple small sessile polyps with no bleeding and                            no stigmata of recent bleeding were found in the                            gastric fundus and in the gastric body. The polyps                            have an appearance consistent with fundic gland                             polyps. Biopsies were taken with a cold forceps for                            histology. Estimated blood loss was minimal.                           The examined duodenum was normal. Biopsies were  taken with a cold forceps for histology. Estimated                            blood loss was minimal.                           The exam was otherwise without abnormality. Complications:            No immediate complications. Estimated blood loss:                            Minimal. Estimated Blood Loss:     Estimated blood loss was minimal. Impression:               - Normal esophagus. Biopsied.                           - Gastritis. Biopsied.                           - Normal examined duodenum. Biopsied. Recommendation:           - Patient has a contact number available for                            emergencies. The signs and symptoms of potential                            delayed complications were discussed with the                            patient. Return to normal activities tomorrow.                            Written discharge instructions were provided to the                            patient.                           - Resume previous diet.                           - Continue present medications.                           - Await pathology results.                           - Return to GI clinic to review these results. Thornton Park MD, MD 04/11/2019 3:15:35 PM This report has been signed electronically.

## 2019-04-11 NOTE — Progress Notes (Signed)
Called to room to assist during endoscopic procedure.  Patient ID and intended procedure confirmed with present staff. Received instructions for my participation in the procedure from the performing physician.  

## 2019-04-11 NOTE — Patient Instructions (Signed)
Handout on gastritis and gerd given to you today  Await pathology results    YOU HAD AN ENDOSCOPIC PROCEDURE TODAY AT [facility]:   Refer to the procedure report that was given to you for any specific questions about what was found during the examination.  If the procedure report does not answer your questions, please call your gastroenterologist to clarify.  If you requested that your care partner not be given the details of your procedure findings, then the procedure report has been included in a sealed envelope for you to review at your convenience later.  YOU SHOULD EXPECT: Some feelings of bloating in the abdomen. Passage of more gas than usual.  Walking can help get rid of the air that was put into your GI tract during the procedure and reduce the bloating. If you had a lower endoscopy (such as a colonoscopy or flexible sigmoidoscopy) you may notice spotting of blood in your stool or on the toilet paper. If you underwent a bowel prep for your procedure, you may not have a normal bowel movement for a few days.  Please Note:  You might notice some irritation and congestion in your nose or some drainage.  This is from the oxygen used during your procedure.  There is no need for concern and it should clear up in a day or so.  SYMPTOMS TO REPORT IMMEDIATELY:   Following upper endoscopy (EGD)  Vomiting of blood or coffee ground material  New chest pain or pain under the shoulder blades  Painful or persistently difficult swallowing  New shortness of breath  Fever of 100F or higher  Black, tarry-looking stools  For urgent or emergent issues, a gastroenterologist can be reached at any hour by calling [phone].   DIET:  We do recommend a small meal at first, but then you may proceed to your regular diet.  Drink plenty of fluids but you should avoid alcoholic beverages for 24 hours.  ACTIVITY:  You should plan to take it easy for the rest of today and you should NOT  DRIVE or use heavy machinery until tomorrow (because of the sedation medicines used during the test).    FOLLOW UP: Our staff will call the number listed on your records 48-72 hours following your procedure to check on you and address any questions or concerns that you may have regarding the information given to you following your procedure. If we do not reach you, we will leave a message.  We will attempt to reach you two times.  During this call, we will ask if you have developed any symptoms of COVID 19. If you develop any symptoms (ie: fever, flu-like symptoms, shortness of breath, cough etc.) before then, please call [phone].  If you test positive for Covid 19 in the 2 weeks post procedure, please call and report this information to [place].    If any biopsies were taken you will be contacted by phone or by letter within the next 1-3 weeks.  Please call us at [phone] if you have not heard about the biopsies in 3 weeks.    SIGNATURES/CONFIDENTIALITY: You and/or your care partner have signed paperwork which will be entered into your electronic medical record.  These signatures attest to the fact that that the information above on your After Visit Summary has been reviewed and is understood.  Full responsibility of the confidentiality of this discharge information lies with you and/or your care-partner.

## 2019-04-13 ENCOUNTER — Telehealth: Payer: Self-pay | Admitting: Family Medicine

## 2019-04-13 ENCOUNTER — Telehealth: Payer: Self-pay

## 2019-04-13 ENCOUNTER — Telehealth: Payer: Self-pay | Admitting: *Deleted

## 2019-04-13 MED ORDER — CEFPROZIL 500 MG PO TABS: 500.0000 mg | ORAL_TABLET | Freq: Two times a day (BID) | ORAL | 0 refills | Status: DC

## 2019-04-13 NOTE — Telephone Encounter (Signed)
Follow up call made, no answer, left message 

## 2019-04-13 NOTE — Telephone Encounter (Signed)
Cefzzil 500 bid ten d

## 2019-04-13 NOTE — Telephone Encounter (Signed)
Prescription sent electronically to pharmacy. Patient notified. 

## 2019-04-13 NOTE — Telephone Encounter (Signed)
  Follow up Call-  Call back number 04/11/2019  Post procedure Call Back phone  # [phone]  Permission to leave phone message Yes  Some recent data might be hidden     No answer

## 2019-04-13 NOTE — Telephone Encounter (Signed)
Pt would like to know if something can be called in for sinus infection or if she needs to go to urgent care. She is having sinus pressure under eyes and when she blows her nose its green and a little bit of blood. It started last Sunday and she has been using saline and taking tylenol sinus medicine and it has not helped. She has not had any fever. If able to call something in please send to [facility], [place]

## 2019-04-17 ENCOUNTER — Encounter: Payer: Self-pay | Admitting: *Deleted

## 2019-04-17 ENCOUNTER — Other Ambulatory Visit: Payer: Self-pay | Admitting: *Deleted

## 2019-04-17 MED ORDER — FAMOTIDINE 20 MG PO TABS: 20.0000 mg | ORAL_TABLET | Freq: Two times a day (BID) | ORAL | 0 refills | Status: DC

## 2019-04-20 DIAGNOSIS — K2950 Unspecified chronic gastritis without bleeding: Secondary | ICD-10-CM

## 2019-04-20 DIAGNOSIS — K3189 Other diseases of stomach and duodenum: Secondary | ICD-10-CM

## 2019-04-24 ENCOUNTER — Encounter: Payer: Self-pay | Admitting: Gastroenterology

## 2019-04-24 ENCOUNTER — Other Ambulatory Visit: Payer: Self-pay

## 2019-04-24 ENCOUNTER — Ambulatory Visit: Payer: BC Managed Care – PPO | Admitting: Gastroenterology

## 2019-04-24 VITALS — BP 110/80 | HR 76 | Temp 97.5°F | Ht 65.0 in | Wt 164.8 lb

## 2019-04-24 DIAGNOSIS — K219 Gastro-esophageal reflux disease without esophagitis: Secondary | ICD-10-CM | POA: Diagnosis not present

## 2019-04-24 DIAGNOSIS — R1013 Epigastric pain: Secondary | ICD-10-CM

## 2019-04-24 DIAGNOSIS — K2970 Gastritis, unspecified, without bleeding: Secondary | ICD-10-CM

## 2019-04-24 NOTE — Patient Instructions (Signed)
Continue Nexium 40 mg twice a day and  famotidine 20 mg twice a day Stop using NSAIDs as able   Avoid caffeine if symptoms persist despite stopping ibuprofen Follow-up in 3-4 months, earlier if needed

## 2019-04-24 NOTE — Progress Notes (Signed)
Referring Provider: Mikey Kirschner, MD Primary Care Physician:  Mikey Kirschner, MD   Chief complaint: Epigastric pain and heartburn   IMPRESSION:  GERD +/- dyspepsia, inadequately controlled on Nexium 40 mg BID    -Esophageal biopsies negative for eosinophilic esophagitis SIBO by breath test 02/07/18 presenting the bloating     - not responding to probiotics    - Increase hydrogen of 21 ppm, increase in methane of 20 ppm    - increase in combined H2 and CH4 of 41 ppm.  History of H. pylori negative gastritis on EGD 08/22/2012 Alternating diarrhea and constipation diagnosed as IBS by Dr. Allen Norris 2018   - triggered by some breads/yeast rolls. Intolerant to sugar.  Lactose intolerance Liver nodule seen on echo 03/28/2019    - "Incidentally noted rounded structure in region of liver (subcostal views)"    - not seen on abdominal ultrasound 04/02/19    -Normal hepatic function panel 03/21/2019 EGD and Colonoscopy with Dr. Candace Cruise 2014 Screening colonoscopy due 2024  Bloating responded to 14-days of rifaximin.   Reflux+/- dyspepsia+/- H pylori negative gastritis improving on Nexium 40 mg twice daily and famotidine 20 mg BID but not completely resolved. Esophageal biopsies show no eosinophilic esophagitis.  Likely caused or worsened by ongoing use of Duexis given the component of ibuprofen. She will review with her prescribing doctor to consider alternatives. We also discussed avoid carbonated beverages and caffeine if she continues to have symptoms after discontinuing all NSAIDs.   Fluoxetine is commonly used to treat function GI disorders. But, there have been rare reports of dyspepsia due to fluoxetine. Given how effective the medication has been, I would not recommend discontinuing at this time, especially given our management plan outlined above.  We reviewed her recent liver nodule seen on echo. I personally reviewed her ultrasound images. Liver enzymes are normal. No follow-up indicated at  this time.   Giver her history of SIBO, I have a low threshold to repeat Xifaxan for recurrent bloating.   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 She was last seen for upper endoscopy 04/11/2019. The interval history is obtained through the patient and review of her electronic health record.  Initially seen in consultation 01/2018 for nausea and bloating.  Symptoms initially improved with 2 weeks of Xifaxan 550 mg 3 times daily x2.  Returned in 12/20 with nonradiating epigastric pain and heartburn despite Nexium 40 mg twice daily. Less bloating and abdominal distension since completing the Xifaxan. Was also using probiotics QOD. Will experience constipation if using it daily. Has diarrhea if she doesn't use it.  EGD 04/11/2019 revealed reflux esophagitis, H. pylori negative gastritis, and fundic gland polyps.  Duodenal biopsies suggested inflammation related to NSAIDs.  I recommended that she continue Nexium 40 mg twice daily and add famotidine 20 mg twice daily for breakthrough symptoms.  She was counseled to avoid all NSAIDs. A  Has done well since endoscopy. After going home she realized that she was using Duexis daily started 05/2018 in lieu of cortisone injection for her back pain.  She wonders if she needs to find a substitute.  Continues to have some epigastric burning triggered by soft drinks especially Dr. Malachi Bonds or green tea  Continues  on probiotics.   She wanted to ask me about her fluoxetine because her husband was concerned about it causing her recent GI symptoms. Her psychiatrist recently increased her dose recently to 60 mg daily.    No new complaints or concerns.   Prior studies in Epic: Abdominal ultrasound 01/11/2014  was normal. HIDA scan 01/18/2014 was normal.  Gallbladder ejection fraction was 83%. Echocardiogram 03/28/2019: Incidentally noted rounded structure in region of liver (subcostal views) Abdominal ultrasound 04/02/2019 to evaluate an incidental liver nodule seen on echo 03/28/2019: Normal.  No visible nodule.  Labs 03/21/2019 show a normal hepatic function panel  Endoscopic evaluation: EGD 08/22/12 with Dr. Candace Cruise: Endoscopically normal; chronic gastritis without H pylori Colonoscopy 08/22/12 with Dr. Candace Cruise: melanosis coli, repeat exam recommended in 10 years EGD 04/11/2019 revealed reflux esophagitis, H. pylori negative gastritis, and fundic gland polyps.  Duodenal biopsies suggested inflammation related to NSAIDs.  Past Medical History:  Diagnosis Date  . Depression   . Desensitization to allergy shot   . GERD (gastroesophageal reflux disease)   . Herpes genitalia   . PONV (postoperative nausea and vomiting)     Past Surgical History:  Procedure Laterality Date  . ABDOMINAL HYSTERECTOMY  2007  . CESAREAN SECTION  4- 2000  . ETHMOIDECTOMY Right 10/21/2016   Procedure: RIGHT TOTAL ETHMOIDECTOMY/ REMOVE ASTEOMA WITHIN RIGHT ETHMOID;  Surgeon: Margaretha Sheffield, MD;  Location: [facility];  Service: ENT;  Laterality: Right;  . IMAGE GUIDED SINUS SURGERY N/A 10/21/2016   Procedure: IMAGE GUIDED SINUS SURGERY;  Surgeon: Margaretha Sheffield, MD;  Location: [facility];  Service: ENT;  Laterality: N/A;  disc given to [facility] 09/23/16  . MAXILLARY ANTROSTOMY Bilateral 10/21/2016   Procedure: MAXILLARY ANTROSTOMY;  Surgeon: Margaretha Sheffield, MD;  Location: [facility];  Service: ENT;  Laterality: Bilateral;    Current Outpatient Medications  Medication Sig Dispense Refill  . acyclovir (ZOVIRAX) 400 MG tablet Take 1 tablet (400 mg total) by mouth 2 (two) times daily. 180 tablet 1  . cefPROZIL (CEFZIL) 500 MG tablet Take 1 tablet (500 mg total) by mouth 2 (two) times daily. 20 tablet 0  .  esomeprazole (NEXIUM) 40 MG capsule Take 1 capsule (40 mg total) by mouth 2 (two) times daily before a meal. 60 capsule 3  . famotidine (PEPCID) 20 MG tablet Take 1 tablet (20 mg total) by mouth 2 (two) times daily. 120 tablet 0  . FLUoxetine (PROZAC) 40 MG capsule Take 40 mg by mouth daily.    . fluticasone (FLONASE) 50 MCG/ACT nasal spray     . traZODone (DESYREL) 50 MG tablet 50 mg. Takes one to two qhs prn  0   No current facility-administered medications for this visit.    Allergies as of 04/24/2019 - Review Complete 04/11/2019  Allergen Reaction Noted  . Biaxin [clarithromycin]  07/25/2014    Family History  Problem Relation Age of Onset  . Stroke Mother   . Hypertension Mother   . Heart disease Father   . Hypertension Father   . Ovarian cancer Maternal Grandmother 80  . Breast cancer Neg Hx   . Colon cancer Neg Hx   . Diabetes Neg Hx     Social History   Socioeconomic History  . Marital status: Married    Spouse name: Not on file  . Number of children: 1  . Years of education: Not on file  . Highest education level: Not on file  Occupational History  . Occupation: bussiness Administrator  Tobacco  Use  . Smoking status: Never Smoker  . Smokeless tobacco: Never Used  Substance and Sexual Activity  . Alcohol use: Yes    Alcohol/week: 0.0 standard drinks    Comment: rare consumption  . Drug use: No  . Sexual activity: Yes    Birth control/protection: Surgical  Other Topics Concern  . Not on file  Social History Narrative  . Not on file   Social Determinants of Health   Financial Resource Strain:   . Difficulty of Paying Living Expenses: Not on file  Food Insecurity:   . Worried About Charity fundraiser in the Last Year: Not on file  . Ran Out of Food in the Last Year: Not on file  Transportation Needs:   . Lack of Transportation (Medical): Not on file  . Lack of Transportation (Non-Medical): Not on file  Physical Activity:   . Days of Exercise per Week: Not  on file  . Minutes of Exercise per Session: Not on file  Stress:   . Feeling of Stress : Not on file  Social Connections:   . Frequency of Communication with Friends and Family: Not on file  . Frequency of Social Gatherings with Friends and Family: Not on file  . Attends Religious Services: Not on file  . Active Member of Clubs or Organizations: Not on file  . Attends Archivist Meetings: Not on file  . Marital Status: Not on file  Intimate Partner Violence:   . Fear of Current or Ex-Partner: Not on file  . Emotionally Abused: Not on file  . Physically Abused: Not on file  . Sexually Abused: Not on file    There were no vitals filed for this visit.  Physical Exam: Vital signs were reviewed. General:   Alert, well-nourished, pleasant and cooperative in NAD Abdomen:  Soft, nontender, normal bowel sounds. No rebound or guarding. No hepatosplenomegaly Neurologic:  Alert and  oriented x4;  grossly nonfocal Skin:  No rash or bruise. Psych:  Alert and cooperative. Normal mood and affect.   Philena Obey L. Tarri Glenn, MD, MPH [facility] 04/24/2019, 2:08 PM

## 2019-05-08 DIAGNOSIS — F411 Generalized anxiety disorder: Secondary | ICD-10-CM | POA: Diagnosis not present

## 2019-05-08 DIAGNOSIS — F330 Major depressive disorder, recurrent, mild: Secondary | ICD-10-CM | POA: Diagnosis not present

## 2019-05-21 DIAGNOSIS — J301 Allergic rhinitis due to pollen: Secondary | ICD-10-CM | POA: Diagnosis not present

## 2019-05-21 DIAGNOSIS — J0140 Acute pansinusitis, unspecified: Secondary | ICD-10-CM | POA: Diagnosis not present

## 2019-06-07 DIAGNOSIS — M25511 Pain in right shoulder: Secondary | ICD-10-CM | POA: Diagnosis not present

## 2019-06-07 DIAGNOSIS — M5412 Radiculopathy, cervical region: Secondary | ICD-10-CM | POA: Diagnosis not present

## 2019-06-15 DIAGNOSIS — M542 Cervicalgia: Secondary | ICD-10-CM | POA: Diagnosis not present

## 2019-06-19 DIAGNOSIS — M5412 Radiculopathy, cervical region: Secondary | ICD-10-CM | POA: Diagnosis not present

## 2019-06-20 ENCOUNTER — Other Ambulatory Visit: Payer: Self-pay | Admitting: Sports Medicine

## 2019-06-20 DIAGNOSIS — M542 Cervicalgia: Secondary | ICD-10-CM

## 2019-06-29 ENCOUNTER — Other Ambulatory Visit: Payer: BC Managed Care – PPO

## 2019-07-02 ENCOUNTER — Ambulatory Visit
Admission: RE | Admit: 2019-07-02 | Discharge: 2019-07-02 | Disposition: A | Discharge status: Home / Self Care | Payer: BC Managed Care – PPO | Source: Ambulatory Visit | Attending: Sports Medicine | Admitting: Sports Medicine

## 2019-07-02 DIAGNOSIS — M542 Cervicalgia: Secondary | ICD-10-CM

## 2019-07-02 DIAGNOSIS — M50122 Cervical disc disorder at C5-C6 level with radiculopathy: Secondary | ICD-10-CM | POA: Diagnosis not present

## 2019-07-02 DIAGNOSIS — M50123 Cervical disc disorder at C6-C7 level with radiculopathy: Secondary | ICD-10-CM | POA: Diagnosis not present

## 2019-07-02 MED ORDER — IOPAMIDOL (ISOVUE-M 300) INJECTION 61%
1.0000 mL | Freq: Once | INTRAMUSCULAR | Status: AC | PRN
  Administered 2019-07-02: 1 mL via EPIDURAL

## 2019-07-02 MED ORDER — TRIAMCINOLONE ACETONIDE 40 MG/ML IJ SUSP (RADIOLOGY)
60.0000 mg | Freq: Once | INTRAMUSCULAR | Status: AC
  Administered 2019-07-02: 60 mg via EPIDURAL

## 2019-07-02 NOTE — Discharge Instructions (Signed)

## 2019-07-13 ENCOUNTER — Ambulatory Visit: Payer: BC Managed Care – PPO | Attending: Internal Medicine

## 2019-08-09 ENCOUNTER — Encounter: Payer: Self-pay | Admitting: Gastroenterology

## 2019-08-09 ENCOUNTER — Ambulatory Visit: Payer: BC Managed Care – PPO | Admitting: Gastroenterology

## 2019-08-09 VITALS — BP 112/68 | HR 90 | Ht 65.0 in | Wt 166.0 lb

## 2019-08-09 DIAGNOSIS — K6389 Other specified diseases of intestine: Secondary | ICD-10-CM | POA: Diagnosis not present

## 2019-08-09 DIAGNOSIS — R140 Abdominal distension (gaseous): Secondary | ICD-10-CM | POA: Diagnosis not present

## 2019-08-09 DIAGNOSIS — K2970 Gastritis, unspecified, without bleeding: Secondary | ICD-10-CM | POA: Diagnosis not present

## 2019-08-09 DIAGNOSIS — K219 Gastro-esophageal reflux disease without esophagitis: Secondary | ICD-10-CM

## 2019-08-09 MED ORDER — ESOMEPRAZOLE MAGNESIUM 40 MG PO CPDR: 40.0000 mg | DELAYED_RELEASE_CAPSULE | ORAL | 11 refills | Status: DC

## 2019-08-09 NOTE — Patient Instructions (Signed)
If you are age 50 or older, your body mass index should be between 23-30. Your Body mass index is 27.62 kg/m. If this is out of the aforementioned range listed, please consider follow up with your Primary Care Provider.  If you are age 67 or younger, your body mass index should be between 19-25. Your Body mass index is 27.62 kg/m. If this is out of the aformentioned range listed, please consider follow up with your Primary Care Provider.   Follow up in 1 year.   It was a pleasure to see you today!  Dr. Thornton Park

## 2019-08-09 NOTE — Progress Notes (Signed)
Referring Provider: Mikey Kirschner, MD Primary Care Physician:  Mikey Kirschner, MD   Chief complaint: Epigastric pain and heartburn   IMPRESSION:  GERD +/- dyspepsia, inadequately controlled on Nexium 40 mg BID    -Esophageal biopsies negative for eosinophilic esophagitis SIBO by breath test 02/07/18 presenting the bloating     - not responding to probiotics    - Increase hydrogen of 21 ppm, increase in methane of 20 ppm    - increase in combined H2 and CH4 of 41 ppm.  History of H. pylori negative gastritis on EGD 08/22/2012 Alternating diarrhea and constipation diagnosed as IBS by Dr. Allen Norris 2018   - triggered by some breads/yeast rolls. Intolerant to sugar.  Lactose intolerance Liver nodule seen on echo 03/28/2019    - "Incidentally noted rounded structure in region of liver (subcostal views)"    - not seen on abdominal ultrasound 04/02/19    -Normal hepatic function panel 03/21/2019 EGD and Colonoscopy with Dr. Candace Cruise 2014 Screening colonoscopy due 2024   Reflux+/- dyspepsia+/- H pylori negative gastritis controlled on Nexium 40 mg daily. Esophageal biopsies show no eosinophilic esophagitis. Recently worsened by [facility], which she is no longer using. There may be concurrent food intolerance.   Giver her history of SIBO with bloating responding to rifaximin, I have a low threshold to repeat Xifaxan for recurrent bloating.   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 She was last seen 04/24/19. The interval history is obtained through the patient and review of her electronic health record.  Initially seen in consultation 01/2018 for nausea and bloating.  Symptoms improved with 2 weeks of Xifaxan 550 mg 3 times daily x2.  Symptoms  returned 12/20 with nonradiating epigastric pain and heartburn despite Nexium 40 mg twice daily. Less bloating and abdominal distension since completing the Xifaxan. Was also using probiotics QOD. Will experience constipation if using it daily. Has diarrhea if she doesn't use it.  EGD 04/11/2019 revealed reflux esophagitis, H. pylori negative gastritis, and fundic gland polyps.  Duodenal biopsies suggested inflammation related to NSAIDs.  I recommended that she continue Nexium 40 mg twice daily and add famotidine 20 mg twice daily for breakthrough symptoms.  She was counseled to avoid all NSAIDs. A  Returned in follow-up 04/24/19 with some epigastric burning, frequently triggered by soft drinks, especially Dr. Malachi Bonds or green tea Continues on probiotics.  Recommended that she avoid NSAIDs. Avoid dairy and caffeine.  Today she reports feeling much. Controlling symptoms with Nexium 40 QAM with the second dose PRN No longer needing famotidine.  Avoiding artificial sweeteners because this seemed to be a trigger her symptoms.  Recently turned 49 and had a symptom escalation after eating cake and soda.   Prior studies in Epic: Abdominal ultrasound 01/11/2014 was normal. HIDA scan 01/18/2014 was normal.  Gallbladder ejection fraction was 83%. Echocardiogram 03/28/2019: Incidentally noted rounded structure in region of liver (subcostal views) Abdominal ultrasound 04/02/2019 to evaluate an incidental liver nodule seen on echo 03/28/2019: Normal.  No visible nodule.  Labs 03/21/2019 show a normal hepatic function panel  Endoscopic evaluation: EGD 08/22/12 with Dr. Candace Cruise: Endoscopically normal; chronic gastritis without H pylori Colonoscopy 08/22/12 with Dr. Candace Cruise: melanosis coli, repeat exam recommended in 10 years  EGD 04/11/2019 revealed reflux esophagitis, H. pylori negative gastritis, and fundic gland polyps.  Duodenal biopsies suggested inflammation related to NSAIDs.  Past Medical History:  Diagnosis Date  .  Depression   . Desensitization to allergy shot   . GERD (gastroesophageal reflux disease)   . Herpes genitalia   . PONV (postoperative nausea and vomiting)     Past Surgical History:  Procedure Laterality Date  . ABDOMINAL HYSTERECTOMY  2007  . CESAREAN SECTION  4- 2000  . ETHMOIDECTOMY Right 10/21/2016   Procedure: RIGHT TOTAL ETHMOIDECTOMY/ REMOVE ASTEOMA WITHIN RIGHT ETHMOID;  Surgeon: Margaretha Sheffield, MD;  Location: [place];  Service: ENT;  Laterality: Right;  . IMAGE GUIDED SINUS SURGERY N/A 10/21/2016   Procedure: IMAGE GUIDED SINUS SURGERY;  Surgeon: Margaretha Sheffield, MD;  Location: [place];  Service: ENT;  Laterality: N/A;  disc given to [facility] 09/23/16  . MAXILLARY ANTROSTOMY Bilateral 10/21/2016   Procedure: MAXILLARY ANTROSTOMY;  Surgeon: Margaretha Sheffield, MD;  Location: [place];  Service: ENT;  Laterality: Bilateral;    Current Outpatient Medications  Medication Sig Dispense Refill  . acyclovir (ZOVIRAX) 400 MG tablet Take 1 tablet (400 mg total) by mouth 2 (two) times daily. 180 tablet 1  . cefPROZIL (CEFZIL) 500 MG tablet Take 1 tablet (500 mg total) by mouth 2 (two) times daily. 20 tablet 0  . esomeprazole (NEXIUM) 40 MG capsule Take 1 capsule (40 mg total) by mouth 1 day or 1 dose. 30 capsule 11  . FLUoxetine (PROZAC) 20 MG tablet Take 60 mg by mouth daily.     . fluticasone (FLONASE) 50 MCG/ACT nasal spray     . Ibuprofen-Famotidine (DUEXIS) 800-26.6 MG TABS Take 1 tablet by mouth as needed.    . traZODone (DESYREL) 50 MG tablet 50 mg. Takes one to two qhs prn  0  . famotidine (PEPCID) 20 MG tablet Take 1 tablet (20 mg total) by mouth 2 (two) times daily. 120 tablet 0   No current facility-administered medications for this visit.    Allergies as of 08/09/2019 - Review Complete 08/09/2019  Allergen Reaction Noted  . Biaxin [clarithromycin] Other (See Comments) 07/25/2014    Family History  Problem Relation Age of Onset  . Stroke Mother    . Hypertension Mother   . Heart disease Father   . Hypertension Father   . Ovarian cancer Maternal Grandmother 80  . Breast cancer Neg Hx   . Colon cancer Neg Hx   . Diabetes Neg Hx   . Stomach cancer Neg Hx   . Pancreatic cancer Neg Hx   . Esophageal cancer Neg Hx     Social History   Socioeconomic History  . Marital status: Married    Spouse name: Not on file  . Number of children: 1  . Years of education: Not on file  . Highest education level: Not on file  Occupational History  . Occupation: bussiness Administrator  Tobacco Use  . Smoking status: Never Smoker  . Smokeless tobacco: Never Used  Vaping Use  . Vaping Use: Never used  Substance and Sexual Activity  . Alcohol use: Yes    Alcohol/week: 0.0 standard drinks    Comment: rare consumption  . Drug use: No  . Sexual activity: Yes    Birth control/protection: Surgical  Other Topics Concern  . Not on file  Social History Narrative  . Not on file   Social Determinants of Health   Financial Resource Strain:   . Difficulty  of Paying Living Expenses:   Food Insecurity:   . Worried About Charity fundraiser in the Last Year:   . Arboriculturist in the Last Year:   Transportation Needs:   . Film/video editor (Medical):   Marland Kitchen Lack of Transportation (Non-Medical):   Physical Activity:   . Days of Exercise per Week:   . Minutes of Exercise per Session:   Stress:   . Feeling of Stress :   Social Connections:   . Frequency of Communication with Friends and Family:   . Frequency of Social Gatherings with Friends and Family:   . Attends Religious Services:   . Active Member of Clubs or Organizations:   . Attends Archivist Meetings:   Marland Kitchen Marital Status:   Intimate Partner Violence:   . Fear of Current or Ex-Partner:   . Emotionally Abused:   Marland Kitchen Physically Abused:   . Sexually Abused:     Filed Weights   08/09/19 1550  Weight: 166 lb (75.3 kg)    Physical Exam: Vital signs were  reviewed. General:   Alert, well-nourished, pleasant and cooperative in NAD Abdomen:  Soft, nontender, normal bowel sounds. No rebound or guarding. No hepatosplenomegaly Neurologic:  Alert and  oriented x4;  grossly nonfocal Skin:  No rash or bruise. Psych:  Alert and cooperative. Normal mood and affect.   Jahron Hunsinger L. Tarri Glenn, MD, MPH [facility] 08/12/2019, 7:26 PM

## 2019-08-12 ENCOUNTER — Encounter: Payer: Self-pay | Admitting: Gastroenterology

## 2019-09-11 DIAGNOSIS — F330 Major depressive disorder, recurrent, mild: Secondary | ICD-10-CM | POA: Diagnosis not present

## 2019-09-11 DIAGNOSIS — F411 Generalized anxiety disorder: Secondary | ICD-10-CM | POA: Diagnosis not present

## 2019-12-24 ENCOUNTER — Telehealth: Payer: Self-pay | Admitting: Gastroenterology

## 2019-12-24 ENCOUNTER — Other Ambulatory Visit: Payer: Self-pay

## 2019-12-24 MED ORDER — ESOMEPRAZOLE MAGNESIUM 40 MG PO CPDR: 40.0000 mg | DELAYED_RELEASE_CAPSULE | ORAL | 11 refills | Status: DC

## 2019-12-24 NOTE — Telephone Encounter (Signed)
Rx has been sent to the pharmacy as requested.   Per Dr.Beavers last office visit, increase to BID dosing

## 2019-12-24 NOTE — Telephone Encounter (Signed)
Patient called requesting for her Nexium medication has been taken them twice a day so she is running out soon would like for the provider to change her dosage for she is having to take more

## 2019-12-27 ENCOUNTER — Other Ambulatory Visit: Payer: Self-pay

## 2019-12-27 MED ORDER — ESOMEPRAZOLE MAGNESIUM 40 MG PO CPDR: 40.0000 mg | DELAYED_RELEASE_CAPSULE | Freq: Two times a day (BID) | ORAL | 6 refills | Status: DC

## 2020-01-02 ENCOUNTER — Encounter: Payer: Self-pay | Admitting: Certified Nurse Midwife

## 2020-01-02 ENCOUNTER — Other Ambulatory Visit (HOSPITAL_COMMUNITY)
Admission: RE | Admit: 2020-01-02 | Discharge: 2020-01-02 | Disposition: A | Discharge status: Home / Self Care | Payer: BC Managed Care – PPO | Source: Ambulatory Visit | Attending: Certified Nurse Midwife | Admitting: Certified Nurse Midwife

## 2020-01-02 ENCOUNTER — Ambulatory Visit (INDEPENDENT_AMBULATORY_CARE_PROVIDER_SITE_OTHER): Payer: BC Managed Care – PPO | Admitting: Certified Nurse Midwife

## 2020-01-02 ENCOUNTER — Other Ambulatory Visit: Payer: Self-pay

## 2020-01-02 VITALS — BP 134/78 | HR 96 | Ht 65.0 in | Wt 168.0 lb

## 2020-01-02 DIAGNOSIS — Z1231 Encounter for screening mammogram for malignant neoplasm of breast: Secondary | ICD-10-CM

## 2020-01-02 DIAGNOSIS — Z01419 Encounter for gynecological examination (general) (routine) without abnormal findings: Secondary | ICD-10-CM | POA: Diagnosis present

## 2020-01-02 MED ORDER — ACYCLOVIR 400 MG PO TABS: 400.0000 mg | ORAL_TABLET | Freq: Two times a day (BID) | ORAL | 3 refills | Status: DC

## 2020-01-02 NOTE — Patient Instructions (Signed)
Preventive Care 40-50 Years Old, Female °Preventive care refers to visits with your health care provider and lifestyle choices that can promote health and wellness. This includes: °· A yearly physical exam. This may also be called an annual well check. °· Regular dental visits and eye exams. °· Immunizations. °· Screening for certain conditions. °· Healthy lifestyle choices, such as eating a healthy diet, getting regular exercise, not using drugs or products that contain nicotine and tobacco, and limiting alcohol use. °What can I expect for my preventive care visit? °Physical exam °Your health care provider will check your: °· Height and weight. This may be used to calculate body mass index (BMI), which tells if you are at a healthy weight. °· Heart rate and blood pressure. °· Skin for abnormal spots. °Counseling °Your health care provider may ask you questions about your: °· Alcohol, tobacco, and drug use. °· Emotional well-being. °· Home and relationship well-being. °· Sexual activity. °· Eating habits. °· Work and work environment. °· Method of birth control. °· Menstrual cycle. °· Pregnancy history. °What immunizations do I need? ° °Influenza (flu) vaccine °· This is recommended every year. °Tetanus, diphtheria, and pertussis (Tdap) vaccine °· You may need a Td booster every 10 years. °Varicella (chickenpox) vaccine °· You may need this if you have not been vaccinated. °Zoster (shingles) vaccine °· You may need this after age 60. °Measles, mumps, and rubella (MMR) vaccine °· You may need at least one dose of MMR if you were born in 1957 or later. You may also need a second dose. °Pneumococcal conjugate (PCV13) vaccine °· You may need this if you have certain conditions and were not previously vaccinated. °Pneumococcal polysaccharide (PPSV23) vaccine °· You may need one or two doses if you smoke cigarettes or if you have certain conditions. °Meningococcal conjugate (MenACWY) vaccine °· You may need this if you  have certain conditions. °Hepatitis A vaccine °· You may need this if you have certain conditions or if you travel or work in places where you may be exposed to hepatitis A. °Hepatitis B vaccine °· You may need this if you have certain conditions or if you travel or work in places where you may be exposed to hepatitis B. °Haemophilus influenzae type b (Hib) vaccine °· You may need this if you have certain conditions. °Human papillomavirus (HPV) vaccine °· If recommended by your health care provider, you may need three doses over 6 months. °You may receive vaccines as individual doses or as more than one vaccine together in one shot (combination vaccines). Talk with your health care provider about the risks and benefits of combination vaccines. °What tests do I need? °Blood tests °· Lipid and cholesterol levels. These may be checked every 5 years, or more frequently if you are over 50 years old. °· Hepatitis C test. °· Hepatitis B test. °Screening °· Lung cancer screening. You may have this screening every year starting at age 55 if you have a 30-pack-year history of smoking and currently smoke or have quit within the past 15 years. °· Colorectal cancer screening. All adults should have this screening starting at age 50 and continuing until age 75. Your health care provider may recommend screening at age 45 if you are at increased risk. You will have tests every 1-10 years, depending on your results and the type of screening test. °· Diabetes screening. This is done by checking your blood sugar (glucose) after you have not eaten for a while (fasting). You may have this   done every 1-3 years.  Mammogram. This may be done every 1-2 years. Talk with your health care provider about when you should start having regular mammograms. This may depend on whether you have a family history of breast cancer.  BRCA-related cancer screening. This may be done if you have a family history of breast, ovarian, tubal, or peritoneal  cancers.  Pelvic exam and Pap test. This may be done every 3 years starting at age 76. Starting at age 89, this may be done every 5 years if you have a Pap test in combination with an HPV test. Other tests  Sexually transmitted disease (STD) testing.  Bone density scan. This is done to screen for osteoporosis. You may have this scan if you are at high risk for osteoporosis. Follow these instructions at home: Eating and drinking  Eat a diet that includes fresh fruits and vegetables, whole grains, lean protein, and low-fat dairy.  Take vitamin and mineral supplements as recommended by your health care provider.  Do not drink alcohol if: ? Your health care provider tells you not to drink. ? You are pregnant, may be pregnant, or are planning to become pregnant.  If you drink alcohol: ? Limit how much you have to 0-1 drink a day. ? Be aware of how much alcohol is in your drink. In the [place] one drink equals one 12 oz bottle of beer (355 mL), one 5 oz glass of wine (148 mL), or one 1 oz glass of hard liquor (44 mL). Lifestyle  Take daily care of your teeth and gums.  Stay active. Exercise for at least 30 minutes on 5 or more days each week.  Do not use any products that contain nicotine or tobacco, such as cigarettes, e-cigarettes, and chewing tobacco. If you need help quitting, ask your health care provider.  If you are sexually active, practice safe sex. Use a condom or other form of birth control (contraception) in order to prevent pregnancy and STIs (sexually transmitted infections).  If told by your health care provider, take low-dose aspirin daily starting at age 37. What's next?  Visit your health care provider once a year for a well check visit.  Ask your health care provider how often you should have your eyes and teeth checked.  Stay up to date on all vaccines. This information is not intended to replace advice given to you by your health care provider. Make sure you  discuss any questions you have with your health care provider. Document Revised: 10/27/2017 Document Reviewed: 10/27/2017 Elsevier Patient Education  2020 [employer].

## 2020-01-02 NOTE — Addendum Note (Signed)
Addended by: Hildred Priest on: 01/02/2020 04:53 PM   Modules accepted: Orders

## 2020-01-02 NOTE — Progress Notes (Signed)
GYNECOLOGY ANNUAL PREVENTATIVE CARE ENCOUNTER NOTE  History:     Daisy Soto is a 49 y.o. G49P1001 female here for a routine annual gynecologic exam.  Current complaints: none.   Denies abnormal vaginal bleeding, discharge, pelvic pain, problems with intercourse or other gynecologic concerns.     Social Relationship: married Living:with husband and 1 step son ( daughter and 2 other step sons moved out) Work: IT consultant  Exercise: 4 x wk , treadmill  Smoke/Alcohol/drug use: denies  Gynecologic History No LMP recorded. Patient has had a hysterectomy. Contraception: none Last Pap: 07/30/2016. Results were: abnormal ASCUS with negative HPV Last mammogram: 03/2019. Results were: normal  Obstetric History OB History  Gravida Para Term Preterm AB Living  1 1 1     1   SAB TAB Ectopic Multiple Live Births          1    # Outcome Date GA Lbr Len/2nd Weight Sex Delivery Anes PTL Lv  1 Term 06/20/98   7 lb (3.175 kg) F CS-Unspec   LIV    Past Medical History:  Diagnosis Date  . COVID-19 02/22/2019  . Depression   . Desensitization to allergy shot   . GERD (gastroesophageal reflux disease)   . Herpes genitalia   . PONV (postoperative nausea and vomiting)     Past Surgical History:  Procedure Laterality Date  . ABDOMINAL HYSTERECTOMY  2007  . CESAREAN SECTION  4- 2000  . ETHMOIDECTOMY Right 10/21/2016   Procedure: RIGHT TOTAL ETHMOIDECTOMY/ REMOVE ASTEOMA WITHIN RIGHT ETHMOID;  Surgeon: Margaretha Sheffield, MD;  Location: [place];  Service: ENT;  Laterality: Right;  . IMAGE GUIDED SINUS SURGERY N/A 10/21/2016   Procedure: IMAGE GUIDED SINUS SURGERY;  Surgeon: Margaretha Sheffield, MD;  Location: [place];  Service: ENT;  Laterality: N/A;  disc given to [facility] 09/23/16  . MAXILLARY ANTROSTOMY Bilateral 10/21/2016   Procedure: MAXILLARY ANTROSTOMY;  Surgeon: Margaretha Sheffield, MD;  Location: [place];  Service: ENT;  Laterality: Bilateral;    Current  Outpatient Medications on File Prior to Visit  Medication Sig Dispense Refill  . acyclovir (ZOVIRAX) 400 MG tablet Take 1 tablet (400 mg total) by mouth 2 (two) times daily. 180 tablet 1  . esomeprazole (NEXIUM) 40 MG capsule Take 1 capsule (40 mg total) by mouth 2 (two) times daily before a meal. 60 capsule 6  . FLUoxetine (PROZAC) 20 MG tablet Take 60 mg by mouth daily.     . fluticasone (FLONASE) 50 MCG/ACT nasal spray     . Ibuprofen-Famotidine (DUEXIS) 800-26.6 MG TABS Take 1 tablet by mouth as needed.    . traZODone (DESYREL) 50 MG tablet 50 mg. Takes one to two qhs prn  0  . cefPROZIL (CEFZIL) 500 MG tablet Take 1 tablet (500 mg total) by mouth 2 (two) times daily. 20 tablet 0  . esomeprazole (NEXIUM) 40 MG capsule Take 1 capsule (40 mg total) by mouth 1 day or 1 dose. 60 capsule 11  . famotidine (PEPCID) 20 MG tablet Take 1 tablet (20 mg total) by mouth 2 (two) times daily. 120 tablet 0   No current facility-administered medications on file prior to visit.    Allergies  Allergen Reactions  . Biaxin [Clarithromycin] Other (See Comments)    Makes stomach burn    Social History:  reports that she has never smoked. She has never used smokeless tobacco. She reports current alcohol use. She reports that she does not use drugs.  Family History  Problem Relation Age of Onset  . Stroke Mother   . Hypertension Mother   . Heart disease Father   . Hypertension Father   . Ovarian cancer Maternal Grandmother 80  . Breast cancer Neg Hx   . Colon cancer Neg Hx   . Diabetes Neg Hx   . Stomach cancer Neg Hx   . Pancreatic cancer Neg Hx   . Esophageal cancer Neg Hx     The following portions of the patient's history were reviewed and updated as appropriate: allergies, current medications, past family history, past medical history, past social history, past surgical history and problem list.  Review of Systems Pertinent items noted in HPI and remainder of comprehensive ROS otherwise  negative.  Physical Exam:  BP 134/78   Pulse 96   Ht 5\' 5"  (1.651 m)   Wt 168 lb (76.2 kg)   BMI 27.96 kg/m  CONSTITUTIONAL: Well-developed, well-nourished female in no acute distress.  HENT:  Normocephalic, atraumatic, External right and left ear normal. Oropharynx is clear and moist EYES: Conjunctivae and EOM are normal. Pupils are equal, round, and reactive to light. No scleral icterus.  NECK: Normal range of motion, supple, no masses.  Normal thyroid.  SKIN: Skin is warm and dry. No rash noted. Not diaphoretic. No erythema. No pallor. MUSCULOSKELETAL: Normal range of motion. No tenderness.  No cyanosis, clubbing, or edema.  2+ distal pulses. NEUROLOGIC: Alert and oriented to person, place, and time. Normal reflexes, muscle tone coordination.  PSYCHIATRIC: Normal mood and affect. Normal behavior. Normal judgment and thought content. CARDIOVASCULAR: Normal heart rate noted, regular rhythm RESPIRATORY: Clear to auscultation bilaterally. Effort and breath sounds normal, no problems with respiration noted. BREASTS: Symmetric in size. No masses, tenderness, skin changes, nipple drainage, or lymphadenopathy bilaterally.  ABDOMEN: Soft, no distention noted.  No tenderness, rebound or guarding.  PELVIC: Normal appearing external genitalia and urethral meatus; normal appearing vaginal mucosa and cervix ( possibly a vaginal cuff) pt unsure if cervix was removed with hysterectomy. .  No abnormal discharge noted.  Pap smear obtained.  Normal uterine size, no other palpable masses, no uterine or adnexal tenderness.  .   Assessment and Plan:    1. Women's annual routine gynecological examination  [id number] , will follow up with results  Mammogram : ordered Labs: declines has done through work Refills: acyclovir  Colonoscopy: done at 40 Referral: none Routine preventative health maintenance measures emphasized. Please refer to After Visit Summary for other counseling recommendations.       Philip Aspen, CNM [facility]

## 2020-01-08 LAB — CYTOLOGY - PAP
Adequacy: ABSENT
Comment: NEGATIVE
Diagnosis: NEGATIVE
High risk HPV: NEGATIVE

## 2020-04-15 ENCOUNTER — Ambulatory Visit
Admission: RE | Admit: 2020-04-15 | Discharge: 2020-04-15 | Disposition: A | Discharge status: Home / Self Care | Payer: BC Managed Care – PPO | Source: Ambulatory Visit | Attending: Certified Nurse Midwife | Admitting: Certified Nurse Midwife

## 2020-04-15 ENCOUNTER — Other Ambulatory Visit: Payer: Self-pay

## 2020-04-15 DIAGNOSIS — Z1231 Encounter for screening mammogram for malignant neoplasm of breast: Secondary | ICD-10-CM | POA: Diagnosis not present

## 2020-04-15 DIAGNOSIS — Z01419 Encounter for gynecological examination (general) (routine) without abnormal findings: Secondary | ICD-10-CM

## 2020-08-21 ENCOUNTER — Telehealth: Payer: BC Managed Care – PPO | Admitting: Certified Nurse Midwife

## 2020-08-21 NOTE — Telephone Encounter (Signed)
Patient called stating that she knows she has a UTI;symptoms include burning, frequent urge to pee only gets a few drops, mild lower back pain. Pt has attempted to treat with OTC AZO and cranberry juice. She was last seen 11-31-2021. Pt is requesting to do a urine drop off if she can. Please Advise.

## 2020-08-22 NOTE — Telephone Encounter (Signed)
[place]  Pt aware per vm office protocol is a urine can be dropped off only if you have been seen in the last 6 months. She will need to make an appt to be seen. She can also be seen at a urgent care or pcp. Will f/u with pt.

## 2020-08-26 NOTE — Telephone Encounter (Signed)
Spoke with pt to f/u on 08/26/2020, she confirmed she did receive care and Tx, she had no questions or concerns.

## 2021-01-07 ENCOUNTER — Encounter: Payer: Self-pay | Admitting: Certified Nurse Midwife

## 2021-01-07 ENCOUNTER — Ambulatory Visit (INDEPENDENT_AMBULATORY_CARE_PROVIDER_SITE_OTHER): Payer: BC Managed Care – PPO | Admitting: Certified Nurse Midwife

## 2021-01-07 ENCOUNTER — Other Ambulatory Visit: Payer: Self-pay

## 2021-01-07 VITALS — BP 127/79 | HR 76 | Ht 65.0 in | Wt 159.7 lb

## 2021-01-07 DIAGNOSIS — Z1231 Encounter for screening mammogram for malignant neoplasm of breast: Secondary | ICD-10-CM

## 2021-01-07 DIAGNOSIS — Z01419 Encounter for gynecological examination (general) (routine) without abnormal findings: Secondary | ICD-10-CM | POA: Diagnosis not present

## 2021-01-07 DIAGNOSIS — Z23 Encounter for immunization: Secondary | ICD-10-CM | POA: Diagnosis not present

## 2021-01-07 MED ORDER — ACYCLOVIR 400 MG PO TABS: 400.0000 mg | ORAL_TABLET | Freq: Two times a day (BID) | ORAL | 3 refills | Status: DC

## 2021-01-07 MED ORDER — TETANUS-DIPHTH-ACELL PERTUSSIS 5-2.5-18.5 LF-MCG/0.5 IM SUSY
0.5000 mL | PREFILLED_SYRINGE | Freq: Once | INTRAMUSCULAR | Status: AC
Start: 2021-01-07 — End: 2021-01-07
  Administered 2021-01-07: 14:00:00 0.5 mL via INTRAMUSCULAR

## 2021-01-07 NOTE — Progress Notes (Signed)
GYNECOLOGY ANNUAL PREVENTATIVE CARE ENCOUNTER NOTE  History:     Daisy Soto is a 50 y.o. G90P1001 female here for a routine annual gynecologic exam.  Current complaints: none.   Has started to have some hot flashes that she is managing ok with. Denies abnormal vaginal bleeding, discharge, pelvic pain, problems with intercourse or other gynecologic concerns.     Social Relationship: married  Living: spouse and step son ( daughter and 2 other step sons moved out) Work: IT consultant  Exercise: walking/treadmill 4 x wk  Smoke/Alcohol/drug use: denies use   Gynecologic History No LMP recorded. Patient has had a hysterectomy. Contraception: status post hysterectomy Last Pap: 01/01/20. Results were: normal with negative HPV Last mammogram: 04/17/2020. Results were: normal  Obstetric History OB History  Gravida Para Term Preterm AB Living  1 1 1     1   SAB IAB Ectopic Multiple Live Births          1    # Outcome Date GA Lbr Len/2nd Weight Sex Delivery Anes PTL Lv  1 Term 06/20/98   7 lb (3.175 kg) F CS-Unspec   LIV    Past Medical History:  Diagnosis Date   COVID-19 02/22/2019   Depression    Desensitization to allergy shot    GERD (gastroesophageal reflux disease)    Herpes genitalia    PONV (postoperative nausea and vomiting)     Past Surgical History:  Procedure Laterality Date   ABDOMINAL HYSTERECTOMY  2007   CESAREAN SECTION  4- 2000   ETHMOIDECTOMY Right 10/21/2016   Procedure: RIGHT TOTAL ETHMOIDECTOMY/ REMOVE ASTEOMA WITHIN RIGHT ETHMOID;  Surgeon: Margaretha Sheffield, MD;  Location: [facility];  Service: ENT;  Laterality: Right;   IMAGE GUIDED SINUS SURGERY N/A 10/21/2016   Procedure: IMAGE GUIDED SINUS SURGERY;  Surgeon: Margaretha Sheffield, MD;  Location: [facility];  Service: ENT;  Laterality: N/A;  disc given to Tabitha 09/23/16   MAXILLARY ANTROSTOMY Bilateral 10/21/2016   Procedure: MAXILLARY ANTROSTOMY;  Surgeon: Margaretha Sheffield, MD;  Location:  [facility];  Service: ENT;  Laterality: Bilateral;    Current Outpatient Medications on File Prior to Visit  Medication Sig Dispense Refill   acyclovir (ZOVIRAX) 400 MG tablet Take 1 tablet (400 mg total) by mouth 2 (two) times daily. 180 tablet 3   esomeprazole (NEXIUM) 40 MG capsule Take 1 capsule (40 mg total) by mouth 2 (two) times daily before a meal. 60 capsule 6   FLUoxetine (PROZAC) 20 MG tablet Take 60 mg by mouth daily.      Ibuprofen-Famotidine (DUEXIS) 800-26.6 MG TABS Take 1 tablet by mouth as needed.     Liothyronine Sodium (CYTOMEL PO) Take by mouth.     traZODone (DESYREL) 50 MG tablet 50 mg. Takes one to two qhs prn  0   fluticasone (FLONASE) 50 MCG/ACT nasal spray  (Patient not taking: Reported on 01/07/2021)     No current facility-administered medications on file prior to visit.    Allergies  Allergen Reactions   Biaxin [Clarithromycin] Other (See Comments)    Makes stomach burn    Social History:  reports that she has never smoked. She has never used smokeless tobacco. She reports current alcohol use. She reports that she does not use drugs.  Family History  Problem Relation Age of Onset   Stroke Mother    Hypertension Mother    Heart disease Father    Hypertension Father    Ovarian cancer Maternal Grandmother  61   Breast cancer Neg Hx    Colon cancer Neg Hx    Diabetes Neg Hx    Stomach cancer Neg Hx    Pancreatic cancer Neg Hx    Esophageal cancer Neg Hx     The following portions of the patient's history were reviewed and updated as appropriate: allergies, current medications, past family history, past medical history, past social history, past surgical history and problem list.  Review of Systems Pertinent items noted in HPI and remainder of comprehensive ROS otherwise negative.  Physical Exam:  BP 127/79   Pulse 76   Ht 5\' 5"  (1.651 m)   Wt 159 lb 11.2 oz (72.4 kg)   BMI 26.58 kg/m  CONSTITUTIONAL: Well-developed, well-nourished  female in no acute distress.  HENT:  Normocephalic, atraumatic, External right and left ear normal. Oropharynx is clear and moist EYES: Conjunctivae and EOM are normal. Pupils are equal, round, and reactive to light. No scleral icterus.  NECK: Normal range of motion, supple, no masses.  Normal thyroid.  SKIN: Skin is warm and dry. No rash noted. Not diaphoretic. No erythema. No pallor. MUSCULOSKELETAL: Normal range of motion. No tenderness.  No cyanosis, clubbing, or edema.  2+ distal pulses. NEUROLOGIC: Alert and oriented to person, place, and time. Normal reflexes, muscle tone coordination.  PSYCHIATRIC: Normal mood and affect. Normal behavior. Normal judgment and thought content. CARDIOVASCULAR: Normal heart rate noted, regular rhythm RESPIRATORY: Clear to auscultation bilaterally. Effort and breath sounds normal, no problems with respiration noted. BREASTS: Symmetric in size. No masses, tenderness, skin changes, nipple drainage, or lymphadenopathy bilaterally.  ABDOMEN: Soft, no distention noted.  No tenderness, rebound or guarding.  PELVIC: Normal appearing external genitalia and urethral meatus; normal appearing vaginal mucosa and cervix.  No abnormal discharge noted.  Pap smear obtained.  Normal uterine size, no other palpable masses, no uterine or adnexal tenderness.  .   Assessment and Plan:    1. Well woman exam with routine gynecological exam  Pap: not indicated Mammogram : ordered Labs:  declines Refills: acyclovir Tdap today  Referral: none  Routine preventative health maintenance measures emphasized. Please refer to After Visit Summary for other counseling recommendations.      Philip Aspen, CNM [facility]

## 2021-05-05 ENCOUNTER — Other Ambulatory Visit: Payer: Self-pay

## 2021-05-05 ENCOUNTER — Ambulatory Visit
Admission: RE | Admit: 2021-05-05 | Discharge: 2021-05-05 | Disposition: A | Discharge status: Home / Self Care | Payer: BC Managed Care – PPO | Source: Ambulatory Visit | Attending: Certified Nurse Midwife | Admitting: Certified Nurse Midwife

## 2021-05-05 ENCOUNTER — Other Ambulatory Visit: Payer: Self-pay | Admitting: Certified Nurse Midwife

## 2021-05-05 DIAGNOSIS — R928 Other abnormal and inconclusive findings on diagnostic imaging of breast: Secondary | ICD-10-CM | POA: Insufficient documentation

## 2021-05-05 DIAGNOSIS — N6489 Other specified disorders of breast: Secondary | ICD-10-CM

## 2021-05-05 DIAGNOSIS — Z1231 Encounter for screening mammogram for malignant neoplasm of breast: Secondary | ICD-10-CM | POA: Insufficient documentation

## 2021-05-05 DIAGNOSIS — Z01419 Encounter for gynecological examination (general) (routine) without abnormal findings: Secondary | ICD-10-CM

## 2021-05-21 ENCOUNTER — Ambulatory Visit
Admission: RE | Admit: 2021-05-21 | Discharge: 2021-05-21 | Disposition: A | Discharge status: Home / Self Care | Payer: BC Managed Care – PPO | Source: Ambulatory Visit | Attending: Certified Nurse Midwife | Admitting: Certified Nurse Midwife

## 2021-05-21 ENCOUNTER — Other Ambulatory Visit: Payer: Self-pay

## 2021-05-21 DIAGNOSIS — N6489 Other specified disorders of breast: Secondary | ICD-10-CM | POA: Diagnosis present

## 2021-05-21 DIAGNOSIS — R928 Other abnormal and inconclusive findings on diagnostic imaging of breast: Secondary | ICD-10-CM

## 2021-05-22 ENCOUNTER — Other Ambulatory Visit: Payer: Self-pay | Admitting: Certified Nurse Midwife

## 2021-05-22 DIAGNOSIS — N6489 Other specified disorders of breast: Secondary | ICD-10-CM

## 2021-05-22 DIAGNOSIS — R928 Other abnormal and inconclusive findings on diagnostic imaging of breast: Secondary | ICD-10-CM

## 2021-05-25 ENCOUNTER — Encounter: Payer: Self-pay | Admitting: Certified Nurse Midwife

## 2021-05-25 ENCOUNTER — Other Ambulatory Visit: Payer: Self-pay | Admitting: Certified Nurse Midwife

## 2021-05-25 DIAGNOSIS — Z09 Encounter for follow-up examination after completed treatment for conditions other than malignant neoplasm: Secondary | ICD-10-CM

## 2021-11-23 ENCOUNTER — Ambulatory Visit
Admission: RE | Admit: 2021-11-23 | Discharge: 2021-11-23 | Disposition: A | Discharge status: Home / Self Care | Payer: BC Managed Care – PPO | Source: Ambulatory Visit | Attending: Certified Nurse Midwife | Admitting: Certified Nurse Midwife

## 2021-11-23 DIAGNOSIS — N6489 Other specified disorders of breast: Secondary | ICD-10-CM | POA: Diagnosis present

## 2021-11-23 DIAGNOSIS — R928 Other abnormal and inconclusive findings on diagnostic imaging of breast: Secondary | ICD-10-CM

## 2021-11-24 ENCOUNTER — Other Ambulatory Visit: Payer: Self-pay | Admitting: Certified Nurse Midwife

## 2021-11-24 ENCOUNTER — Encounter: Payer: Self-pay | Admitting: Certified Nurse Midwife

## 2021-11-24 DIAGNOSIS — N6489 Other specified disorders of breast: Secondary | ICD-10-CM

## 2022-01-12 ENCOUNTER — Encounter: Payer: Self-pay | Admitting: Certified Nurse Midwife

## 2022-01-19 ENCOUNTER — Encounter: Payer: Self-pay | Admitting: Certified Nurse Midwife

## 2022-01-19 ENCOUNTER — Ambulatory Visit (INDEPENDENT_AMBULATORY_CARE_PROVIDER_SITE_OTHER): Payer: BC Managed Care – PPO | Admitting: Certified Nurse Midwife

## 2022-01-19 VITALS — BP 123/78 | HR 84 | Resp 16 | Ht 65.0 in | Wt 155.9 lb

## 2022-01-19 DIAGNOSIS — Z01419 Encounter for gynecological examination (general) (routine) without abnormal findings: Secondary | ICD-10-CM | POA: Diagnosis not present

## 2022-01-19 DIAGNOSIS — Z1231 Encounter for screening mammogram for malignant neoplasm of breast: Secondary | ICD-10-CM

## 2022-01-19 MED ORDER — ACYCLOVIR 400 MG PO TABS: 400.0000 mg | ORAL_TABLET | Freq: Two times a day (BID) | ORAL | 3 refills | Status: DC

## 2022-01-19 NOTE — Progress Notes (Signed)
GYNECOLOGY ANNUAL PREVENTATIVE CARE ENCOUNTER NOTE  History:     ADELYNN GIPE is a 51 y.o. G82P1001 female here for a routine annual gynecologic exam.  Current complaints: none.   Denies abnormal vaginal bleeding, discharge, pelvic pain, problems with intercourse or other gynecologic concerns.     Social Relationship: married  Living: spouse  Work: IT consultant Exercise: walking 3 x wk Smoke/Alcohol/drug use: denies use   Gynecologic History No LMP recorded. Patient has had a hysterectomy. Contraception: status post hysterectomy Last Pap: 01/01/2020. Results were: normal with negative HPV/ Last mammogram: 11/23/2021. Results were: abnormal/  Obstetric History OB History  Gravida Para Term Preterm AB Living  '1 1 1     1  '$ SAB IAB Ectopic Multiple Live Births          1    # Outcome Date GA Lbr Len/2nd Weight Sex Delivery Anes PTL Lv  1 Term 06/20/98   7 lb (3.175 kg) F CS-Unspec   LIV    Past Medical History:  Diagnosis Date   COVID-19 02/22/2019   Depression    Desensitization to allergy shot    GERD (gastroesophageal reflux disease)    Herpes genitalia    PONV (postoperative nausea and vomiting)     Past Surgical History:  Procedure Laterality Date   ABDOMINAL HYSTERECTOMY  2007   CESAREAN SECTION  4- 2000   ETHMOIDECTOMY Right 10/21/2016   Procedure: RIGHT TOTAL ETHMOIDECTOMY/ REMOVE ASTEOMA WITHIN RIGHT ETHMOID;  Surgeon: Margaretha Sheffield, MD;  Location: [facility];  Service: ENT;  Laterality: Right;   IMAGE GUIDED SINUS SURGERY N/A 10/21/2016   Procedure: IMAGE GUIDED SINUS SURGERY;  Surgeon: Margaretha Sheffield, MD;  Location: [facility];  Service: ENT;  Laterality: N/A;  disc given to Tabitha 09/23/16   MAXILLARY ANTROSTOMY Bilateral 10/21/2016   Procedure: MAXILLARY ANTROSTOMY;  Surgeon: Margaretha Sheffield, MD;  Location: [facility];  Service: ENT;  Laterality: Bilateral;    Current Outpatient Medications on File Prior to Visit   Medication Sig Dispense Refill   acyclovir (ZOVIRAX) 400 MG tablet Take 1 tablet (400 mg total) by mouth 2 (two) times daily. 180 tablet 3   cetirizine (ZYRTEC) 10 MG tablet Take 10 mg by mouth daily as needed.     dexamethasone (DECADRON) 4 MG tablet Take 4 mg by mouth daily.     FLUoxetine (PROZAC) 20 MG tablet Take 60 mg by mouth daily.      fluticasone (FLONASE) 50 MCG/ACT nasal spray      Liothyronine Sodium (CYTOMEL PO) Take by mouth.     traZODone (DESYREL) 50 MG tablet 50 mg. Takes one to two qhs prn  0   esomeprazole (NEXIUM) 40 MG capsule Take 1 capsule (40 mg total) by mouth 2 (two) times daily before a meal. 60 capsule 6   Ibuprofen-Famotidine (DUEXIS) 800-26.6 MG TABS Take 1 tablet by mouth as needed. (Patient not taking: Reported on 01/19/2022)     No current facility-administered medications on file prior to visit.    Allergies  Allergen Reactions   Clarithromycin Other (See Comments) and Nausea And Vomiting    Makes stomach burn    Social History:  reports that she has never smoked. She has never used smokeless tobacco. She reports current alcohol use. She reports that she does not use drugs.  Family History  Problem Relation Age of Onset   Stroke Mother    Hypertension Mother    Heart disease Father  Hypertension Father    Ovarian cancer Maternal Grandmother 2   Breast cancer Neg Hx    Colon cancer Neg Hx    Diabetes Neg Hx    Stomach cancer Neg Hx    Pancreatic cancer Neg Hx    Esophageal cancer Neg Hx     The following portions of the patient's history were reviewed and updated as appropriate: allergies, current medications, past family history, past medical history, past social history, past surgical history and problem list.  Review of Systems Pertinent items noted in HPI and remainder of comprehensive ROS otherwise negative.  Physical Exam:  BP 123/78   Pulse 84   Resp 16   Ht '5\' 5"'$  (1.651 m)   Wt 155 lb 14.4 oz (70.7 kg)   BMI 25.94 kg/m   CONSTITUTIONAL: Well-developed, well-nourished female in no acute distress.  HENT:  Normocephalic, atraumatic, External right and left ear normal. Oropharynx is clear and moist EYES: Conjunctivae and EOM are normal. Pupils are equal, round, and reactive to light. No scleral icterus.  NECK: Normal range of motion, supple, no masses.  Normal thyroid.  SKIN: Skin is warm and dry. No rash noted. Not diaphoretic. No erythema. No pallor. MUSCULOSKELETAL: Normal range of motion. No tenderness.  No cyanosis, clubbing, or edema.  2+ distal pulses. NEUROLOGIC: Alert and oriented to person, place, and time. Normal reflexes, muscle tone coordination.  PSYCHIATRIC: Normal mood and affect. Normal behavior. Normal judgment and thought content. CARDIOVASCULAR: Normal heart rate noted, regular rhythm RESPIRATORY: Clear to auscultation bilaterally. Effort and breath sounds normal, no problems with respiration noted. BREASTS: Symmetric in size. No masses, tenderness, skin changes, nipple drainage, or lymphadenopathy bilaterally.  ABDOMEN: Soft, no distention noted.  No tenderness, rebound or guarding.  PELVIC: Normal appearing external genitalia and urethral meatus; normal appearing vaginal mucosa.  No abnormal discharge noted.  Pap smear not due.  Uterus absent, no other palpable masses or adnexal tenderness.  .   Assessment and Plan:    1. 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 Please refer to After Visit Summary for other counseling recommendations.      Philip Aspen, [facility]  [facility]

## 2022-04-07 ENCOUNTER — Other Ambulatory Visit: Payer: Self-pay | Admitting: Certified Nurse Midwife

## 2022-04-07 DIAGNOSIS — N6489 Other specified disorders of breast: Secondary | ICD-10-CM

## 2022-05-18 ENCOUNTER — Ambulatory Visit
Admission: RE | Admit: 2022-05-18 | Discharge: 2022-05-18 | Disposition: A | Discharge status: Home / Self Care | Payer: No Typology Code available for payment source | Source: Ambulatory Visit | Attending: Certified Nurse Midwife | Admitting: Certified Nurse Midwife

## 2022-05-18 DIAGNOSIS — N6489 Other specified disorders of breast: Secondary | ICD-10-CM | POA: Insufficient documentation

## 2022-06-21 ENCOUNTER — Encounter: Payer: Self-pay | Admitting: Nurse Practitioner

## 2022-08-19 ENCOUNTER — Ambulatory Visit: Payer: No Typology Code available for payment source | Admitting: Nurse Practitioner

## 2022-09-16 NOTE — Progress Notes (Signed)
09/17/2022 Daisy Soto [id number] 02/09/1971  Referring provider: Jerl Mina, MD Primary GI doctor: Dr. Leonides Schanz (Dr. Orvan Falconer)  ASSESSMENT AND PLAN:   Gastroesophageal reflux disease without esophagitis Better off fluoxetine but patient it back on it, continue to work with PCP on alternative medications, had through work up in the past.  Lifestyle changes discussed, avoid NSAIDS, ETOH Smoking cessation discussed in detail Start on nexium 40 mg every other day or every day.  Emphasized PPI 30 minutes to an hour before food  Screen for colon cancer We have discussed the risks of bleeding, infection, perforation, medication reactions, and remote risk of death associated with colonoscopy. All questions were answered and the patient acknowledges these risk and wishes to proceed.  Irritable bowel syndrome with both constipation and diarrhea Continue clear eating. Add on fiber, miralax.  Given information about FODMAP   Patient Care Team: Jerl Mina, MD as PCP - General (Family Medicine)  HISTORY OF PRESENT ILLNESS: 52 y.o. female with a past medical history of GERD and others listed below presents for evaluation of discuss colonoscopy.   Abdominal ultrasound 01/11/2014 was normal. HIDA scan 01/18/2014 was normal.  Gallbladder ejection fraction was 83%. Echocardiogram 03/28/2019: Incidentally noted rounded structure in region of liver (subcostal views) Abdominal ultrasound 04/02/2019 to evaluate an incidental liver nodule seen on echo 03/28/2019: Normal.  No visible nodule. Colonoscopy 08/22/12 with Dr. Bluford Kaufmann: melanosis coli, repeat exam recommended in 10 years  01/2018 consultation nausea bloating symptoms improved 2 weeks of Xifaxan 3 times daily 04/11/2019 EGD revealed reflux esophagitis, H. pylori negative gastritis, and fundic gland polyps. Duodenal biopsies suggested inflammation related to NSAIDs.   She continues to have GERD, she has had for years but recently went off  of fluoxetine end of Jan and the GERD got better within a few weeks. She then cut back on the nexium 40 mg twice to as needed.  She tried several other medication for anxiety however had side effects and has been back on 20 mg in the last month and just increased to 40 mg on Monday. She has had 2 reflux this week and having to take nexium.  She is also doing clean eating and did food sensitivity test, avoiding gluten, eggs and dairy.  No dysphagia, no melena, no AB pain.  She has had more constipation lately with clean.    She denies blood thinner use.  She reports NSAID use, ibuprofen rare. She reports ETOH use, socially but rare.  She denies tobacco use.  She denies drug use.    She  reports that she has never smoked. She has never used smokeless tobacco. She reports current alcohol use. She reports that she does not use drugs.  RELEVANT LABS AND IMAGING: CBC    Component Value Date/Time   WBC 4.5 12/04/2014 0741   RBC 4.22 12/04/2014 0741   HGB 13.2 12/04/2014 0741   HCT 38.8 12/04/2014 0741   PLT 209 12/04/2014 0741   MCV 92 12/04/2014 0741   MCH 31.3 12/04/2014 0741   MCHC 34.0 12/04/2014 0741   RDW 13.7 12/04/2014 0741   LYMPHSABS 1.0 12/04/2014 0741   EOSABS 0.1 12/04/2014 0741   BASOSABS 0.0 12/04/2014 0741   No results for input(s): "HGB" in the last 8760 hours.  CMP     Component Value Date/Time   NA 137 03/21/2019 1130   NA 139 12/04/2014 0741   K 3.8 03/21/2019 1130   CL 99 03/21/2019 1130   CO2 26 03/21/2019  1130   GLUCOSE 96 03/21/2019 1130   BUN 15 03/21/2019 1130   BUN 11 12/04/2014 0741   CREATININE 0.63 03/21/2019 1130   CALCIUM 9.1 03/21/2019 1130   PROT 8.0 03/21/2019 1130   PROT 6.6 12/04/2014 0741   ALBUMIN 4.3 03/21/2019 1130   ALBUMIN 4.1 12/04/2014 0741   AST 24 03/21/2019 1130   ALT 24 03/21/2019 1130   ALKPHOS 73 03/21/2019 1130   BILITOT 0.5 03/21/2019 1130   BILITOT 0.3 12/04/2014 0741   GFRNONAA >60 03/21/2019 1130   GFRAA >60  03/21/2019 1130      Latest Ref Rng & Units 03/21/2019   11:30 AM 12/04/2014    7:41 AM  Hepatic Function  Total Protein 6.5 - 8.1 g/dL 8.0  6.6   Albumin 3.5 - 5.0 g/dL 4.3  4.1   AST 15 - 41 U/L 24  13   ALT 0 - 44 U/L 24  12   Alk Phosphatase 38 - 126 U/L 73  80   Total Bilirubin 0.3 - 1.2 mg/dL 0.5  0.3   Bilirubin, Direct 0.0 - 0.2 mg/dL 0.1  1.61       Current Medications:   Current Outpatient Medications (Endocrine & Metabolic):    Liothyronine Sodium (CYTOMEL PO), Take by mouth.   Current Outpatient Medications (Respiratory):    cetirizine (ZYRTEC) 10 MG tablet, Take 10 mg by mouth daily as needed.   fluticasone (FLONASE) 50 MCG/ACT nasal spray,     Current Outpatient Medications (Other):    acyclovir (ZOVIRAX) 400 MG tablet, Take 1 tablet (400 mg total) by mouth 2 (two) times daily.   FLUoxetine (PROZAC) 40 MG capsule, Take 40 mg by mouth daily.   traZODone (DESYREL) 50 MG tablet, 50 mg. Takes one to two qhs prn   esomeprazole (NEXIUM) 40 MG capsule, Take 1 capsule (40 mg total) by mouth daily before breakfast.  Medical History:  Past Medical History:  Diagnosis Date   COVID-19 02/22/2019   Depression    Desensitization to allergy shot    GERD (gastroesophageal reflux disease)    Herpes genitalia    PONV (postoperative nausea and vomiting)    Allergies:  Allergies  Allergen Reactions   Clarithromycin Other (See Comments) and Nausea And Vomiting    Makes stomach burn     Surgical History:  She  has a past surgical history that includes Abdominal hysterectomy (2007); Cesarean section (4- 2000); Image guided sinus surgery (N/A, 10/21/2016); Maxillary antrostomy (Bilateral, 10/21/2016); and Ethmoidectomy (Right, 10/21/2016). Family History:  Her family history includes Heart disease in her father; Hypertension in her father and mother; Ovarian cancer (age of onset: 49) in her maternal grandmother; Stroke in her mother.  REVIEW OF SYSTEMS  : All other systems  reviewed and negative except where noted in the History of Present Illness.  PHYSICAL EXAM: BP (!) 146/72   Pulse 75   Ht 5\' 5"  (1.651 m)   Wt 158 lb (71.7 kg)   BMI 26.29 kg/m  General Appearance: Well nourished, in no apparent distress. Head:   Normocephalic and atraumatic. Eyes:  sclerae anicteric,conjunctive pink  Respiratory: Respiratory effort normal, BS equal bilaterally without rales, rhonchi, wheezing. Cardio: RRR with no MRGs. Peripheral pulses intact.  Abdomen: Soft,  Non-distended ,active bowel sounds. No tenderness . Marland Kitchen No masses. Rectal: Not evaluated Musculoskeletal: Full ROM, Normal gait. Without edema. Skin:  Dry and intact without significant lesions or rashes Neuro: Alert and  oriented x4;  No focal deficits. Psych:  Cooperative. Normal mood and affect.    Doree Albee, PA-C 8:53 AM

## 2022-09-17 ENCOUNTER — Ambulatory Visit: Payer: 59 | Admitting: Physician Assistant

## 2022-09-17 ENCOUNTER — Encounter: Payer: Self-pay | Admitting: Physician Assistant

## 2022-09-17 VITALS — BP 146/72 | HR 75 | Ht 65.0 in | Wt 158.0 lb

## 2022-09-17 DIAGNOSIS — K219 Gastro-esophageal reflux disease without esophagitis: Secondary | ICD-10-CM | POA: Diagnosis not present

## 2022-09-17 DIAGNOSIS — Z1211 Encounter for screening for malignant neoplasm of colon: Secondary | ICD-10-CM | POA: Diagnosis not present

## 2022-09-17 DIAGNOSIS — K582 Mixed irritable bowel syndrome: Secondary | ICD-10-CM | POA: Diagnosis not present

## 2022-09-17 MED ORDER — ESOMEPRAZOLE MAGNESIUM 40 MG PO CPDR: 40.0000 mg | DELAYED_RELEASE_CAPSULE | Freq: Every day | ORAL | 6 refills | Status: DC

## 2022-09-17 MED ORDER — NA SULFATE-K SULFATE-MG SULF 17.5-3.13-1.6 GM/177ML PO SOLN
1.0000 | Freq: Once | ORAL | 0 refills | Status: AC
Start: 2022-09-17 — End: 2022-09-17

## 2022-09-17 NOTE — Patient Instructions (Addendum)
You have been scheduled for a colonoscopy. Please follow written instructions given to you at your visit today.   Please pick up your prep supplies at the pharmacy within the next 1-3 days.  If you use inhalers (even only as needed), please bring them with you on the day of your procedure.  DO NOT TAKE 7 DAYS PRIOR TO TEST- Trulicity (dulaglutide) Ozempic, Wegovy (semaglutide) Mounjaro (tirzepatide) Bydureon Bcise (exanatide extended release)  DO NOT TAKE 1 DAY PRIOR TO YOUR TEST Rybelsus (semaglutide) Adlyxin (lixisenatide) Victoza (liraglutide) Byetta (exanatide) ___________________________________________________________________________   Please take your proton pump inhibitor medication, nexium 40 mg every other day or daily Please take this medication 30 minutes to 1 hour before meals- this makes it more effective.  Avoid spicy and acidic foods Avoid fatty foods Limit your intake of coffee, tea, alcohol, and carbonated drinks Work to maintain a healthy weight Keep the head of the bed elevated at least 3 inches with blocks or a wedge pillow if you are having any nighttime symptoms Stay upright for 2 hours after eating Avoid meals and snacks three to four hours before bedtime  Miralax is an osmotic laxative.  It only brings more water into the stool.  This is safe to take daily.  Can take up to 17 gram of miralax twice a day.  Mix with juice or coffee.  Start 1 capful at night for 3-4 days and reassess your response in 3-4 days.  You can increase and decrease the dose based on your response.  Remember, it can take up to 3-4 days to take effect OR for the effects to wear off.   I often pair this with benefiber/citracel in the morning to help assure the stool is not too loose.      FODMAP stands for fermentable oligo-, di-, mono-saccharides and polyols (1). These are the scientific terms used to classify groups of carbs that are difficult for our body to digest and that  are notorious for triggering digestive symptoms like bloating, gas, loose stools and stomach pain.   You can try low FODMAP diet  - start with eliminating just one column at a time that you feel may be a trigger for you. - the table at the very bottom contains foods that are low in FODMAPs   Sometimes trying to eliminate the FODMAP's from your diet is difficult or tricky, if you are stuggling with trying to do the elimination diet you can try an enzyme.  There is a food enzymes that you sprinkle in or on your food that helps break down the FODMAP. You can read more about the enzyme by going to this site: https://fodzyme.com/   _______________________________________________________  If your blood pressure at your visit was 140/90 or greater, please contact your primary care physician to follow up on this.  _______________________________________________________  If you are age 53 or older, your body mass index should be between 23-30. Your Body mass index is 26.29 kg/m. If this is out of the aforementioned range listed, please consider follow up with your Primary Care Provider.  If you are age 66 or younger, your body mass index should be between 19-25. Your Body mass index is 26.29 kg/m. If this is out of the aformentioned range listed, please consider follow up with your Primary Care Provider.   ________________________________________________________  The [facility] providers would like to encourage you to use [facility] to communicate with providers for non-urgent requests or questions.  Due to long hold times on the  telephone, sending your provider a message by [facility] may be a faster and more efficient way to get a response.  Please allow 48 business hours for a response.  Please remember that this is for non-urgent requests.  _______________________________________________________ It was a pleasure to see you today!  Thank you for trusting me with your gastrointestinal care!

## 2022-09-17 NOTE — Progress Notes (Signed)
I agree with the assessment and plan as outlined by Ms. Collier. 

## 2022-09-23 ENCOUNTER — Telehealth: Payer: Self-pay | Admitting: Physician Assistant

## 2022-09-23 ENCOUNTER — Other Ambulatory Visit: Payer: Self-pay

## 2022-09-23 MED ORDER — SUTAB 1479-225-188 MG PO TABS: ORAL_TABLET | ORAL | 0 refills | Status: DC

## 2022-09-23 NOTE — Telephone Encounter (Signed)
Inbound call from patient requesting a new prep prescription for ' pill form" instead, please advise.  Thank you

## 2022-09-24 ENCOUNTER — Other Ambulatory Visit: Payer: Self-pay

## 2022-09-24 MED ORDER — SUTAB 1479-225-188 MG PO TABS: ORAL_TABLET | ORAL | 0 refills | Status: DC

## 2022-10-18 ENCOUNTER — Encounter: Payer: Self-pay | Admitting: Internal Medicine

## 2022-10-19 ENCOUNTER — Telehealth: Payer: Self-pay | Admitting: Physician Assistant

## 2022-10-19 NOTE — Telephone Encounter (Signed)
PT will like to speak about her pre certification for her colonoscopy on 8/28

## 2022-10-21 LAB — LAB REPORT - SCANNED: A1c: 6.1

## 2022-10-27 ENCOUNTER — Ambulatory Visit: Payer: No Typology Code available for payment source | Admitting: Internal Medicine

## 2022-10-27 ENCOUNTER — Encounter: Payer: Self-pay | Admitting: Internal Medicine

## 2022-10-27 VITALS — BP 132/74 | HR 76 | Temp 97.7°F | Resp 12 | Ht 65.0 in | Wt 158.0 lb

## 2022-10-27 DIAGNOSIS — Z1211 Encounter for screening for malignant neoplasm of colon: Secondary | ICD-10-CM

## 2022-10-27 DIAGNOSIS — K635 Polyp of colon: Secondary | ICD-10-CM | POA: Diagnosis not present

## 2022-10-27 DIAGNOSIS — D123 Benign neoplasm of transverse colon: Secondary | ICD-10-CM

## 2022-10-27 MED ORDER — SODIUM CHLORIDE 0.9 % IV SOLN
500.0000 mL | Freq: Once | INTRAVENOUS | Status: DC
Start: 2022-10-27 — End: 2022-10-27

## 2022-10-27 NOTE — Progress Notes (Signed)
GASTROENTEROLOGY PROCEDURE H&P NOTE   Primary Care Physician: Jerl Mina, MD    Reason for Procedure:   Colon cancer screening  Plan:    Colonoscopy  Patient is appropriate for endoscopic procedure(s) in the ambulatory ([facility]) setting.  The nature of the procedure, as well as the risks, benefits, and alternatives were carefully and thoroughly reviewed with the patient. Ample time for discussion and questions allowed. The patient understood, was satisfied, and agreed to proceed.     HPI: Daisy Soto is a 52 y.o. female who presents for colonoscopy for evaluation of colon cancer screening .  Patient was most recently seen in the Gastroenterology Clinic on 09/17/22.  No interval change in medical history since that appointment. Please refer to that note for full details regarding GI history and clinical presentation.   Past Medical History:  Diagnosis Date   COVID-19 02/22/2019   Depression    Desensitization to allergy shot    GERD (gastroesophageal reflux disease)    Herpes genitalia    PONV (postoperative nausea and vomiting)     Past Surgical History:  Procedure Laterality Date   ABDOMINAL HYSTERECTOMY  2007   CESAREAN SECTION  4- 2000   ETHMOIDECTOMY Right 10/21/2016   Procedure: RIGHT TOTAL ETHMOIDECTOMY/ REMOVE ASTEOMA WITHIN RIGHT ETHMOID;  Surgeon: Vernie Murders, MD;  Location: [facility];  Service: ENT;  Laterality: Right;   IMAGE GUIDED SINUS SURGERY N/A 10/21/2016   Procedure: IMAGE GUIDED SINUS SURGERY;  Surgeon: Vernie Murders, MD;  Location: [facility];  Service: ENT;  Laterality: N/A;  disc given to Tabitha 09/23/16   MAXILLARY ANTROSTOMY Bilateral 10/21/2016   Procedure: MAXILLARY ANTROSTOMY;  Surgeon: Vernie Murders, MD;  Location: [facility];  Service: ENT;  Laterality: Bilateral;    Prior to Admission medications   Medication Sig Start Date End Date Taking? Authorizing Provider  acyclovir (ZOVIRAX) 400 MG tablet Take 1 tablet  (400 mg total) by mouth 2 (two) times daily. 01/19/22  Yes Doreene Burke, CNM  cetirizine (ZYRTEC) 10 MG tablet Take 10 mg by mouth daily as needed. 11/15/21  Yes [provider]  esomeprazole (NEXIUM) 40 MG capsule Take 1 capsule (40 mg total) by mouth daily before breakfast. 09/17/22  Yes Doree Albee, PA-C  FLUoxetine (PROZAC) 40 MG capsule Take 40 mg by mouth daily.   Yes [provider]  fluticasone Aleda Grana) 50 MCG/ACT nasal spray  11/26/14  Yes [provider]  Liothyronine Sodium (CYTOMEL PO) Take by mouth.   Yes [provider]  traZODone (DESYREL) 50 MG tablet 50 mg. Takes one to two qhs prn 05/28/14  Yes [provider]    Current Outpatient Medications  Medication Sig Dispense Refill   acyclovir (ZOVIRAX) 400 MG tablet Take 1 tablet (400 mg total) by mouth 2 (two) times daily. 180 tablet 3   cetirizine (ZYRTEC) 10 MG tablet Take 10 mg by mouth daily as needed.     esomeprazole (NEXIUM) 40 MG capsule Take 1 capsule (40 mg total) by mouth daily before breakfast. 90 capsule 6   FLUoxetine (PROZAC) 40 MG capsule Take 40 mg by mouth daily.     fluticasone (FLONASE) 50 MCG/ACT nasal spray      Liothyronine Sodium (CYTOMEL PO) Take by mouth.     traZODone (DESYREL) 50 MG tablet 50 mg. Takes one to two qhs prn  0   Current Facility-Administered Medications  Medication Dose Route Frequency Provider Last Rate Last Admin   0.9 %  sodium chloride infusion  500 mL Intravenous Once Imogene Burn, MD        Allergies as of 10/27/2022 - Review Complete 10/27/2022  Allergen Reaction Noted   Clarithromycin Other (See Comments) and Nausea And Vomiting 07/25/2014    Family History  Problem Relation Age of Onset   Stroke Mother    Hypertension Mother    Heart disease Father    Hypertension Father    Ovarian cancer Maternal Grandmother 67   Breast cancer Neg Hx    Colon cancer Neg Hx    Diabetes Neg Hx    Stomach cancer Neg Hx     Pancreatic cancer Neg Hx    Esophageal cancer Neg Hx    Rectal cancer Neg Hx     Social History   Socioeconomic History   Marital status: Married    Spouse name: Not on file   Number of children: 1   Years of education: Not on file   Highest education level: Not on file  Occupational History   Occupation: [employer] developer  Tobacco Use   Smoking status: Never   Smokeless tobacco: Never  Vaping Use   Vaping status: Never Used  Substance and Sexual Activity   Alcohol use: Yes    Alcohol/week: 0.0 standard drinks of alcohol    Comment: rare consumption   Drug use: No   Sexual activity: Yes    Birth control/protection: Surgical  Other Topics Concern   Not on file  Social History Narrative   Not on file   Social Determinants of Health   Financial Resource Strain: Not on file  Food Insecurity: Not on file  Transportation Needs: Not on file  Physical Activity: Insufficiently Active (08/25/2017)   Exercise Vital Sign    Days of Exercise per Week: 4 days    Minutes of Exercise per Session: 20 min  Stress: Not on file  Social Connections: Not on file  Intimate Partner Violence: Not on file    Physical Exam: Vital signs in last 24 hours: BP (!) 146/81   Pulse 83   Temp 97.7 F (36.5 C)   Ht 5\' 5"  (1.651 m)   Wt 158 lb (71.7 kg)   SpO2 97%   BMI 26.29 kg/m  GEN: NAD EYE: Sclerae anicteric ENT: MMM CV: Non-tachycardic Pulm: No increased WOB GI: Soft NEURO:  Alert & Oriented   Eulah Pont, MD [facility]   10/27/2022 1:23 PM

## 2022-10-27 NOTE — Progress Notes (Signed)
Called to room to assist during endoscopic procedure.  Patient ID and intended procedure confirmed with present staff. Received instructions for my participation in the procedure from the performing physician.  

## 2022-10-27 NOTE — Op Note (Signed)
[facility] Patient Name: Daisy Soto Procedure Date: 10/27/2022 2:00 PM MRN: [id number] Endoscopist: Madelyn Brunner Melmore , , [id number] Age: 52 Referring MD:  Date of Birth: 1970-06-17 Gender: Female Account #: [id number] Procedure:                Colonoscopy Indications:              Screening for colorectal malignant neoplasm Medicines:                Monitored Anesthesia Care Procedure:                Pre-Anesthesia Assessment:                           - Prior to the procedure, a History and Physical                            was performed, and patient medications and                            allergies were reviewed. The patient's tolerance of                            previous anesthesia was also reviewed. The risks                            and benefits of the procedure and the sedation                            options and risks were discussed with the patient.                            All questions were answered, and informed consent                            was obtained. Prior Anticoagulants: The patient has                            taken no anticoagulant or antiplatelet agents. ASA                            Grade Assessment: I - A normal, healthy patient.                            After reviewing the risks and benefits, the patient                            was deemed in satisfactory condition to undergo the                            procedure.                           After obtaining informed consent, the colonoscope  was passed under direct vision. Throughout the                            procedure, the patient's blood pressure, pulse, and                            oxygen saturations were monitored continuously. The                            Olympus CF-HQ190L ([id number]) Colonoscope was                            introduced through the anus and advanced to the the                            terminal ileum. The  colonoscopy was performed                            without difficulty. The patient tolerated the                            procedure well. The quality of the bowel                            preparation was good. The terminal ileum, ileocecal                            valve, appendiceal orifice, and rectum were                            photographed. Scope In: 2:05:14 PM Scope Out: 2:22:23 PM Scope Withdrawal Time: 0 hours 14 minutes 3 seconds  Total Procedure Duration: 0 hours 17 minutes 9 seconds  Findings:                 The terminal ileum appeared normal.                           A 3 mm polyp was found in the transverse colon. The                            polyp was sessile. The polyp was removed with a                            cold snare. Resection and retrieval were complete.                           A few diverticula were found in the sigmoid colon.                           Non-bleeding internal hemorrhoids were found during                            retroflexion. Complications:  No immediate complications. Estimated Blood Loss:     Estimated blood loss was minimal. Impression:               - The examined portion of the ileum was normal.                           - One 3 mm polyp in the transverse colon, removed                            with a cold snare. Resected and retrieved.                           - Diverticulosis in the sigmoid colon.                           - Non-bleeding internal hemorrhoids. Recommendation:           - Discharge patient to home (with escort).                           - Await pathology results.                           - The findings and recommendations were discussed                            with the patient. Dr Particia Lather "Alan Ripper" [place],  10/27/2022 2:33:40 PM

## 2022-10-27 NOTE — Patient Instructions (Signed)
Please read handouts provided Await pathology results.   YOU HAD AN ENDOSCOPIC PROCEDURE TODAY AT [facility]:   Refer to the procedure report that was given to you for any specific questions about what was found during the examination.  If the procedure report does not answer your questions, please call your gastroenterologist to clarify.  If you requested that your care partner not be given the details of your procedure findings, then the procedure report has been included in a sealed envelope for you to review at your convenience later.  YOU SHOULD EXPECT: Some feelings of bloating in the abdomen. Passage of more gas than usual.  Walking can help get rid of the air that was put into your GI tract during the procedure and reduce the bloating. If you had a lower endoscopy (such as a colonoscopy or flexible sigmoidoscopy) you may notice spotting of blood in your stool or on the toilet paper. If you underwent a bowel prep for your procedure, you may not have a normal bowel movement for a few days.  Please Note:  You might notice some irritation and congestion in your nose or some drainage.  This is from the oxygen used during your procedure.  There is no need for concern and it should clear up in a day or so.  SYMPTOMS TO REPORT IMMEDIATELY:  Following lower endoscopy (colonoscopy or flexible sigmoidoscopy):  Excessive amounts of blood in the stool  Significant tenderness or worsening of abdominal pains  Swelling of the abdomen that is new, acute  Fever of 100F or higher  For urgent or emergent issues, a gastroenterologist can be reached at any hour by calling [phone]. Do not use MyChart messaging for urgent concerns.    DIET:  We do recommend a small meal at first, but then you may proceed to your regular diet.  Drink plenty of fluids but you should avoid alcoholic beverages for 24 hours.  ACTIVITY:  You should plan to take it easy for the rest of today and you should  NOT DRIVE or use heavy machinery until tomorrow (because of the sedation medicines used during the test).    FOLLOW UP: Our staff will call the number listed on your records the next business day following your procedure.  We will call around 7:15- 8:00 am to check on you and address any questions or concerns that you may have regarding the information given to you following your procedure. If we do not reach you, we will leave a message.     If any biopsies were taken you will be contacted by phone or by letter within the next 1-3 weeks.  Please call us at [phone] if you have not heard about the biopsies in 3 weeks.    SIGNATURES/CONFIDENTIALITY: You and/or your care partner have signed paperwork which will be entered into your electronic medical record.  These signatures attest to the fact that that the information above on your After Visit Summary has been reviewed and is understood.  Full responsibility of the confidentiality of this discharge information lies with you and/or your care-partner.

## 2022-10-28 ENCOUNTER — Telehealth: Payer: Self-pay

## 2022-10-28 NOTE — Telephone Encounter (Signed)
  Follow up Call-     10/27/2022    1:17 PM  Call back number  Post procedure Call Back phone  # [phone]  Permission to leave phone message Yes     Patient questions:  Do you have a fever, pain , or abdominal swelling? No. Pain Score  0 *  Have you tolerated food without any problems? Yes.    Have you been able to return to your normal activities? Yes.    Do you have any questions about your discharge instructions: Diet   No. Medications  No. Follow up visit  No.  Do you have questions or concerns about your Care? No.  Actions: * If pain score is 4 or above: No action needed, pain <4.

## 2022-11-05 ENCOUNTER — Encounter: Payer: Self-pay | Admitting: Internal Medicine

## 2022-11-11 IMAGING — MG MM DIGITAL SCREENING BILAT W/ TOMO AND CAD
6 of 12 series · 6 of 36 positions shown · non-contrast
Comparison: Previous exam(s).

CLINICAL DATA: Screening.

EXAM:
DIGITAL SCREENING BILATERAL MAMMOGRAM WITH TOMOSYNTHESIS AND CAD
TECHNIQUE: Bilateral screening digital craniocaudal and mediolateral oblique
mammograms were obtained. Bilateral screening digital breast
tomosynthesis was performed. The images were evaluated with
computer-aided detection.

[R CC synth-2D (1 of 2)]
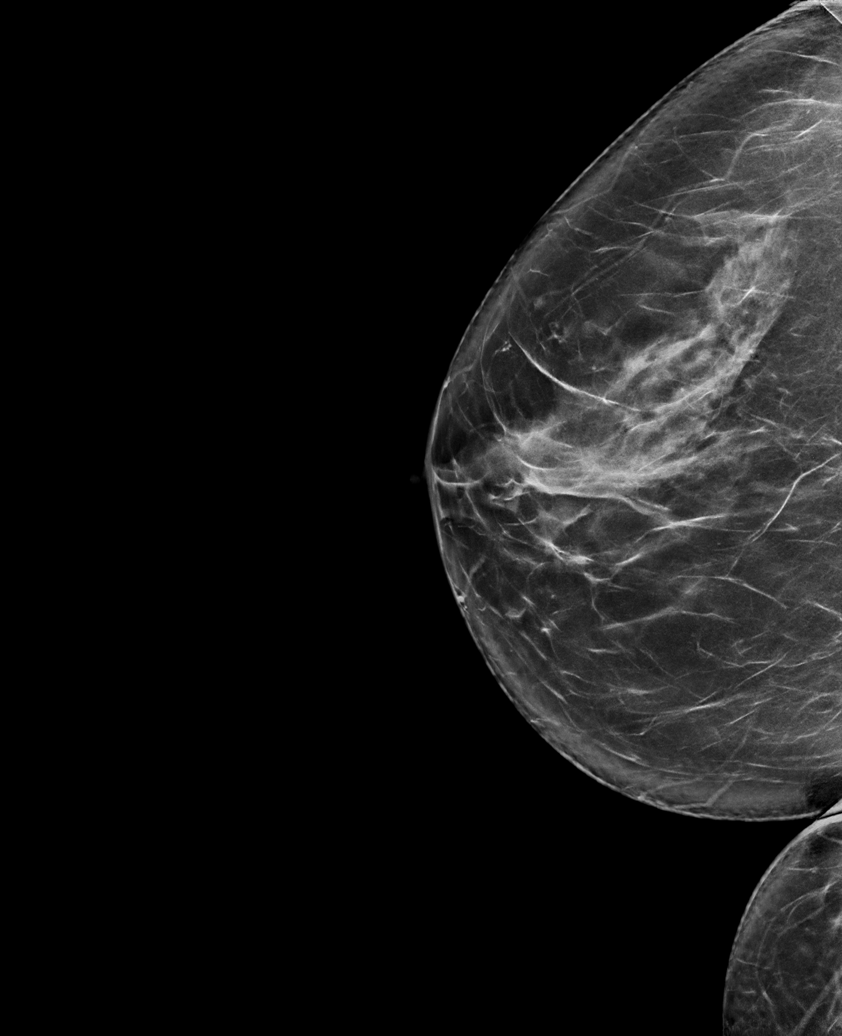

[R MLO synth-2D (1 of 2)]
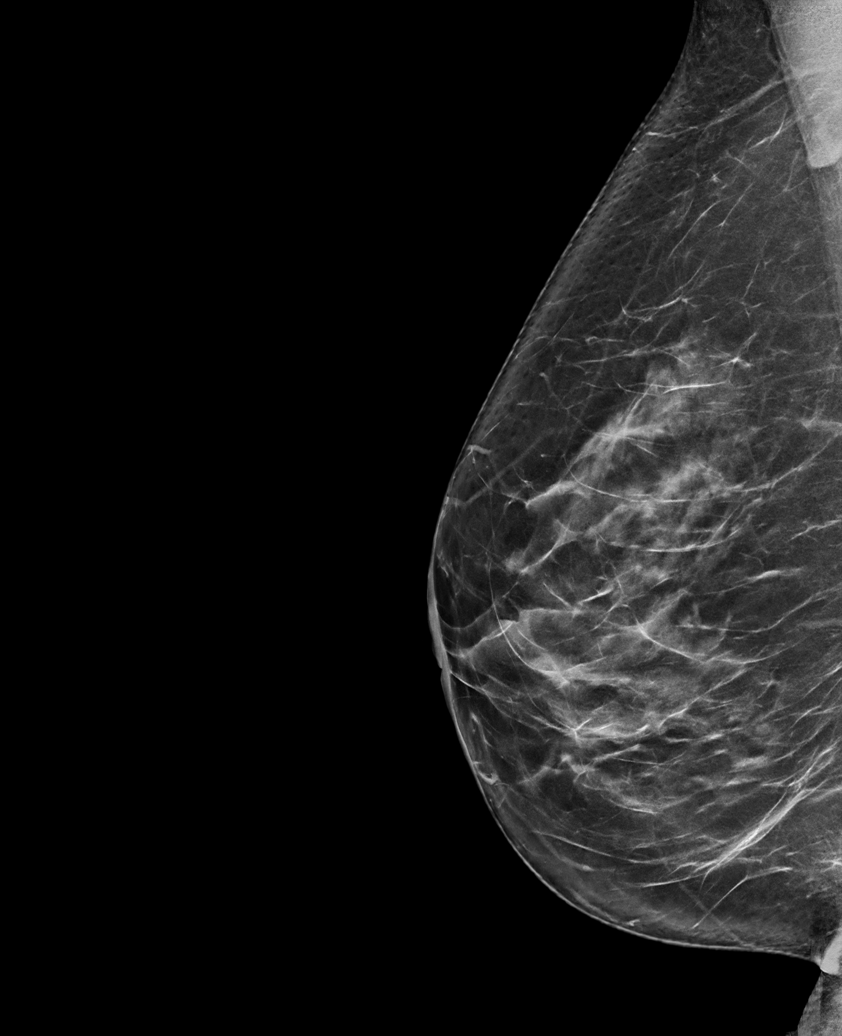

[L CC synth-2D]
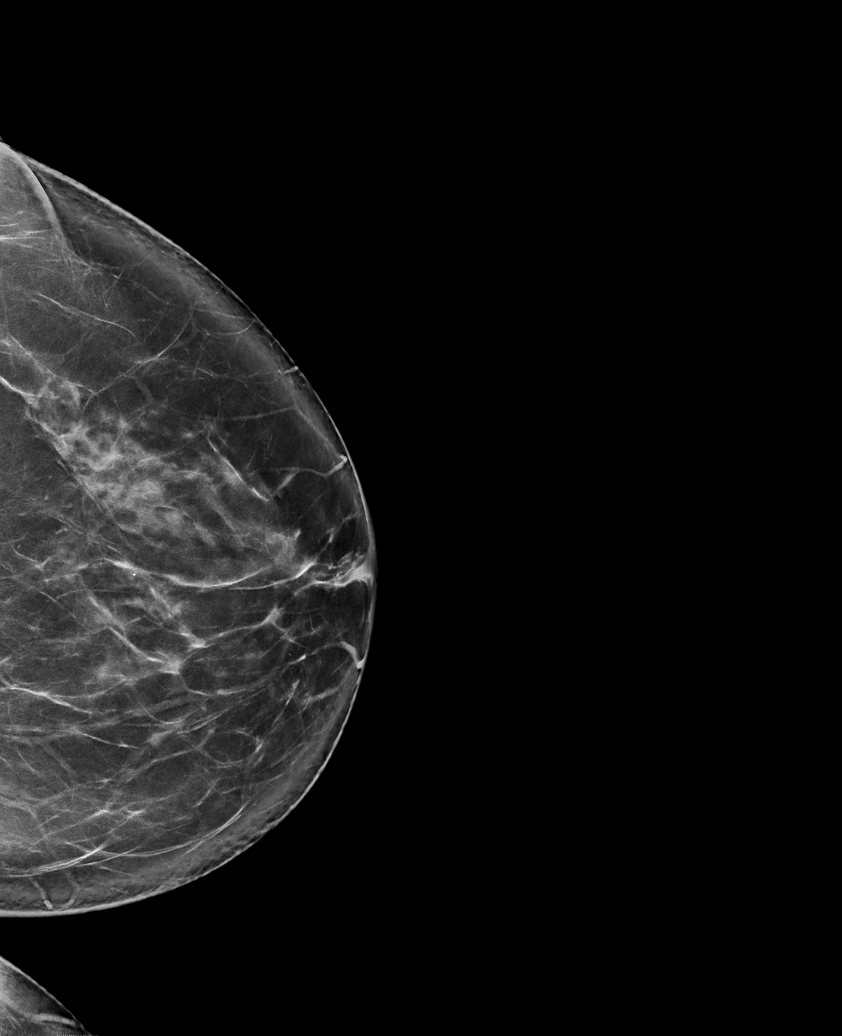

[L MLO synth-2D]
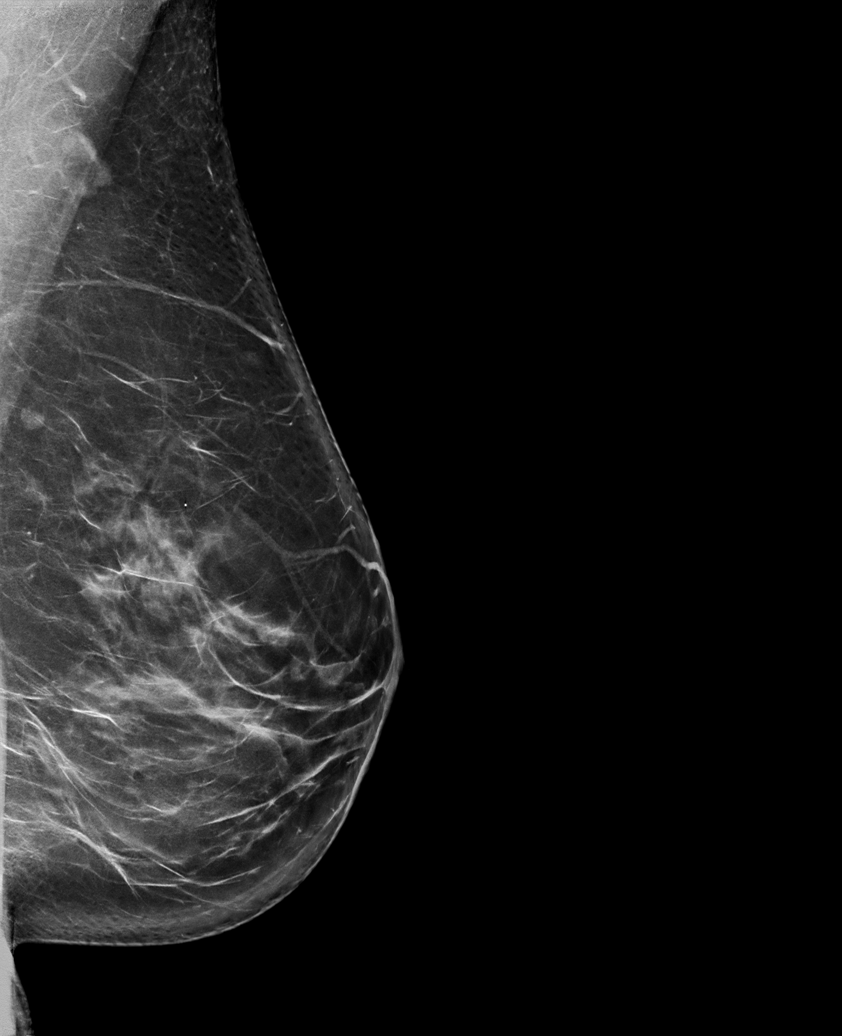

[R MLO synth-2D (2 of 2)]
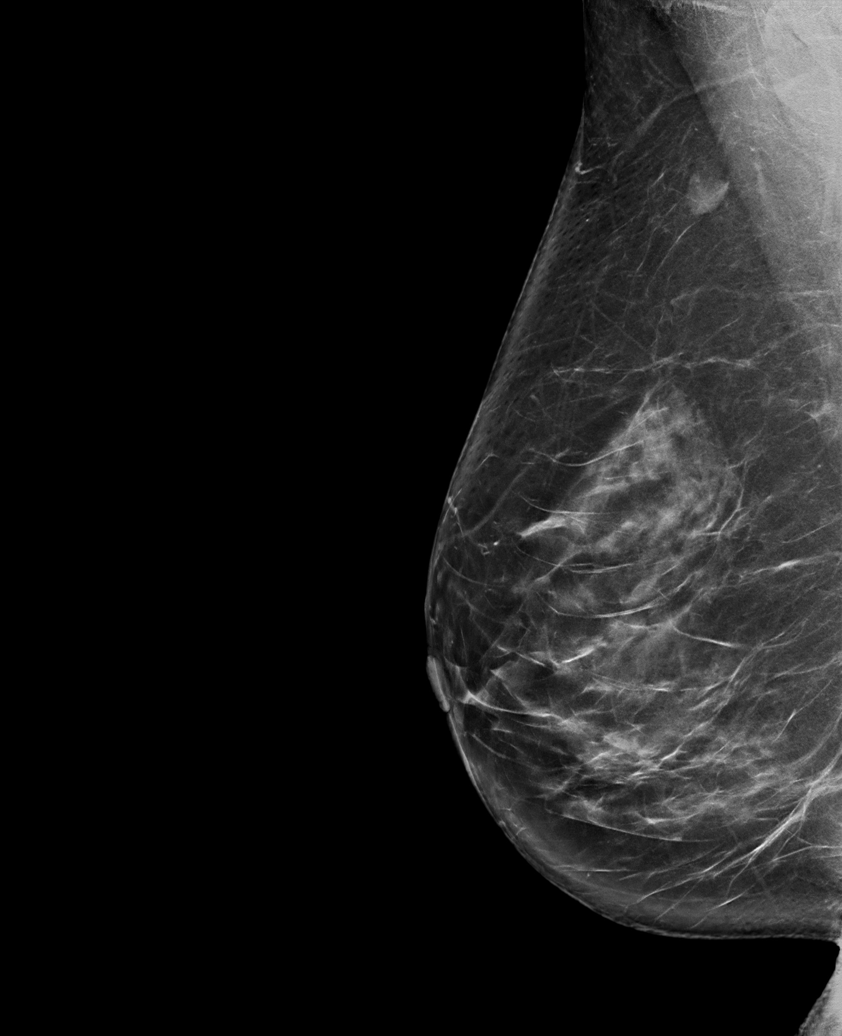

[R CC synth-2D (2 of 2)]
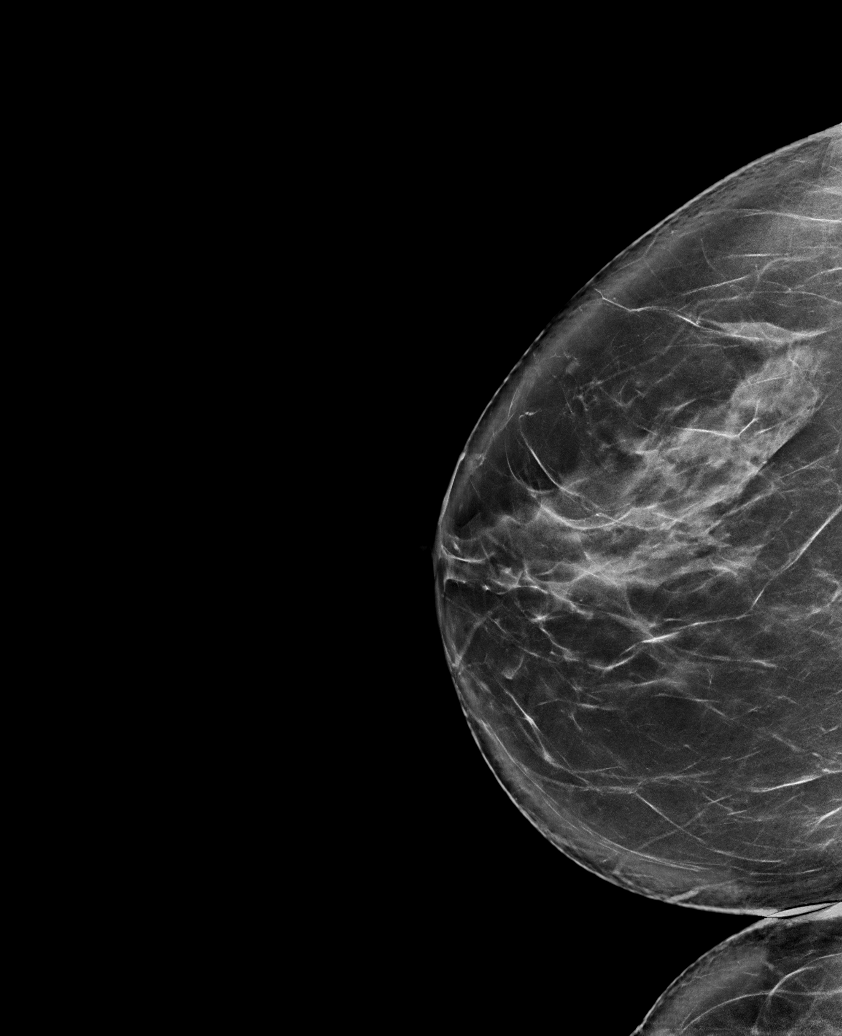

[6 of 36 positions shown; findings below may reference images not displayed]

ACR Breast Density Category b: There are scattered areas of
fibroglandular density.
FINDINGS: There are no findings suspicious for malignancy.
IMPRESSION: No mammographic evidence of malignancy. A result letter of this
screening mammogram will be mailed directly to the patient.

RECOMMENDATION:
Screening mammogram in one year. (Code:51-O-LD2)

BI-RADS CATEGORY  1: Negative.

## 2023-01-10 ENCOUNTER — Telehealth: Payer: Self-pay | Admitting: Certified Nurse Midwife

## 2023-01-10 NOTE — Telephone Encounter (Signed)
Left message for patient to call office back to schedule annual with AT after 01/20/23

## 2023-02-04 ENCOUNTER — Encounter: Payer: Self-pay | Admitting: Certified Nurse Midwife

## 2023-02-04 ENCOUNTER — Ambulatory Visit (INDEPENDENT_AMBULATORY_CARE_PROVIDER_SITE_OTHER): Payer: No Typology Code available for payment source | Admitting: Certified Nurse Midwife

## 2023-02-04 VITALS — BP 127/77 | HR 76 | Ht 65.0 in | Wt 160.4 lb

## 2023-02-04 DIAGNOSIS — Z1231 Encounter for screening mammogram for malignant neoplasm of breast: Secondary | ICD-10-CM

## 2023-02-04 DIAGNOSIS — Z01419 Encounter for gynecological examination (general) (routine) without abnormal findings: Secondary | ICD-10-CM

## 2023-02-04 MED ORDER — ACYCLOVIR 400 MG PO TABS: 400.0000 mg | ORAL_TABLET | Freq: Two times a day (BID) | ORAL | 3 refills | Status: DC

## 2023-02-04 NOTE — Progress Notes (Signed)
GYNECOLOGY ANNUAL PREVENTATIVE CARE ENCOUNTER NOTE  History:     Daisy Soto is a 52 y.o. G19P1001 female here for a routine annual gynecologic exam.  Current complaints: none.   Denies abnormal vaginal bleeding, discharge, pelvic pain, problems with intercourse or other gynecologic concerns.     Social Relationship:married Living:with spouse Work:full time Exercise: walking 3x a week Smoke/Alcohol/drug ZOX:WRUEAV tobacco use/ occasional alcohol use/ no history of drug use  Gynecologic History No LMP recorded. Patient has had a hysterectomy. Contraception: status post hysterectomy Last Pap: 08/05/2016. Results were: abnormal with negative HPV Last Mammogram: 05/18/2022. Results were: stable: Bi Rads 3- probably benign.  Last Colonoscopy-10/27/2022  Obstetric History OB History  Gravida Para Term Preterm AB Living  1 1 1     1   SAB IAB Ectopic Multiple Live Births          1    # Outcome Date GA Lbr Len/2nd Weight Sex Type Anes PTL Lv  1 Term 06/20/98   7 lb (3.175 kg) F CS-Unspec   LIV    Past Medical History:  Diagnosis Date   COVID-19 02/22/2019   Depression    Desensitization to allergy shot    GERD (gastroesophageal reflux disease)    Herpes genitalia    PONV (postoperative nausea and vomiting)     Past Surgical History:  Procedure Laterality Date   ABDOMINAL HYSTERECTOMY  2007   CESAREAN SECTION  4- 2000   ETHMOIDECTOMY Right 10/21/2016   Procedure: RIGHT TOTAL ETHMOIDECTOMY/ REMOVE ASTEOMA WITHIN RIGHT ETHMOID;  Surgeon: Vernie Murders, MD;  Location: [facility];  Service: ENT;  Laterality: Right;   IMAGE GUIDED SINUS SURGERY N/A 10/21/2016   Procedure: IMAGE GUIDED SINUS SURGERY;  Surgeon: Vernie Murders, MD;  Location: [facility];  Service: ENT;  Laterality: N/A;  disc given to Tabitha 09/23/16   MAXILLARY ANTROSTOMY Bilateral 10/21/2016   Procedure: MAXILLARY ANTROSTOMY;  Surgeon: Vernie Murders, MD;  Location: [facility];   Service: ENT;  Laterality: Bilateral;    Current Outpatient Medications on File Prior to Visit  Medication Sig Dispense Refill   acyclovir (ZOVIRAX) 400 MG tablet Take 1 tablet (400 mg total) by mouth 2 (two) times daily. 180 tablet 3   cetirizine (ZYRTEC) 10 MG tablet Take 10 mg by mouth daily as needed.     esomeprazole (NEXIUM) 40 MG capsule Take 1 capsule (40 mg total) by mouth daily before breakfast. 90 capsule 6   FLUoxetine (PROZAC) 40 MG capsule Take 40 mg by mouth daily.     fluticasone (FLONASE) 50 MCG/ACT nasal spray      Liothyronine Sodium (CYTOMEL PO) Take by mouth.     traZODone (DESYREL) 50 MG tablet 50 mg. Takes one to two qhs prn  0   No current facility-administered medications on file prior to visit.    Allergies  Allergen Reactions   Clarithromycin Other (See Comments) and Nausea And Vomiting    Makes stomach burn    Social History:  reports that she has never smoked. She has never used smokeless tobacco. She reports current alcohol use. She reports that she does not use drugs.  Family History  Problem Relation Age of Onset   Stroke Mother    Hypertension Mother    Heart disease Father    Hypertension Father    Ovarian cancer Maternal Grandmother 83   Breast cancer Neg Hx    Colon cancer Neg Hx    Diabetes  Neg Hx    Stomach cancer Neg Hx    Pancreatic cancer Neg Hx    Esophageal cancer Neg Hx    Rectal cancer Neg Hx     The following portions of the patient's history were reviewed and updated as appropriate: allergies, current medications, past family history, past medical history, past social history, past surgical history and problem list.  Review of Systems Pertinent items noted in HPI and remainder of comprehensive ROS otherwise negative.  Physical Exam:  BP 127/77   Pulse 76   Ht 5\' 5"  (1.651 m)   Wt 160 lb 6.4 oz (72.8 kg)   BMI 26.69 kg/m  CONSTITUTIONAL: Well-developed, well-nourished female in no acute distress.  HENT:  Normocephalic,  atraumatic, External right and left ear normal. Oropharynx is clear and moist EYES: Conjunctivae and EOM are normal. Pupils are equal, round, and reactive to light. No scleral icterus.  NECK: Normal range of motion, supple, no masses.  Normal thyroid.  SKIN: Skin is warm and dry. No rash noted. Not diaphoretic. No erythema. No pallor. MUSCULOSKELETAL: Normal range of motion. No tenderness.  No cyanosis, clubbing, or edema.  2+ distal pulses. NEUROLOGIC: Alert and oriented to person, place, and time. Normal reflexes, muscle tone coordination.  PSYCHIATRIC: Normal mood and affect. Normal behavior. Normal judgment and thought content. CARDIOVASCULAR: Normal heart rate noted, regular rhythm RESPIRATORY: Clear to auscultation bilaterally. Effort and breath sounds normal, no problems with respiration noted. BREASTS: Symmetric in size. No masses, tenderness, skin changes, nipple drainage, or lymphadenopathy bilaterally.  ABDOMEN: Soft, no distention noted.  No tenderness, rebound or guarding.  PELVIC: Normal appearing external genitalia and urethral meatus; normal appearing vaginal mucosa and cervix.  No abnormal discharge noted.  Pap smear not due.  Uterus absent, no other palpable masses, or adnexal tenderness.  .   Assessment and Plan:    Annual Gyn Exam   Pap: not due  Mammogram : ordered  Labs: has done through work  Refills: acyclovir  Referral: none  Routine preventative health maintenance measures emphasized. Please refer to After Visit Summary for other counseling recommendations.      Doreene Burke, CNM [facility]  [facility],  [facility]

## 2023-02-04 NOTE — Patient Instructions (Signed)
Preventive Care 40-53 Years Old, Female Preventive care refers to lifestyle choices and visits with your health care provider that can promote health and wellness. Preventive care visits are also called wellness exams. What can I expect for my preventive care visit? Counseling Your health care provider may ask you questions about your: Medical history, including: Past medical problems. Family medical history. Pregnancy history. Current health, including: Menstrual cycle. Method of birth control. Emotional well-being. Home life and relationship well-being. Sexual activity and sexual health. Lifestyle, including: Alcohol, nicotine or tobacco, and drug use. Access to firearms. Diet, exercise, and sleep habits. Work and work environment. Sunscreen use. Safety issues such as seatbelt and bike helmet use. Physical exam Your health care provider will check your: Height and weight. These may be used to calculate your BMI (body mass index). BMI is a measurement that tells if you are at a healthy weight. Waist circumference. This measures the distance around your waistline. This measurement also tells if you are at a healthy weight and may help predict your risk of certain diseases, such as type 2 diabetes and high blood pressure. Heart rate and blood pressure. Body temperature. Skin for abnormal spots. What immunizations do I need?  Vaccines are usually given at various ages, according to a schedule. Your health care provider will recommend vaccines for you based on your age, medical history, and lifestyle or other factors, such as travel or where you work. What tests do I need? Screening Your health care provider may recommend screening tests for certain conditions. This may include: Lipid and cholesterol levels. Diabetes screening. This is done by checking your blood sugar (glucose) after you have not eaten for a while (fasting). Pelvic exam and Pap test. Hepatitis B test. Hepatitis C  test. HIV (human immunodeficiency virus) test. STI (sexually transmitted infection) testing, if you are at risk. Lung cancer screening. Colorectal cancer screening. Mammogram. Talk with your health care provider about when you should start having regular mammograms. This may depend on whether you have a family history of breast cancer. BRCA-related cancer screening. This may be done if you have a family history of breast, ovarian, tubal, or peritoneal cancers. Bone density scan. This is done to screen for osteoporosis. Talk with your health care provider about your test results, treatment options, and if necessary, the need for more tests. Follow these instructions at home: Eating and drinking  Eat a diet that includes fresh fruits and vegetables, whole grains, lean protein, and low-fat dairy products. Take vitamin and mineral supplements as recommended by your health care provider. Do not drink alcohol if: Your health care provider tells you not to drink. You are pregnant, may be pregnant, or are planning to become pregnant. If you drink alcohol: Limit how much you have to 0-1 drink a day. Know how much alcohol is in your drink. In the [place] one drink equals one 12 oz bottle of beer (355 mL), one 5 oz glass of wine (148 mL), or one 1 oz glass of hard liquor (44 mL). Lifestyle Brush your teeth every morning and night with fluoride toothpaste. Floss one time each day. Exercise for at least 30 minutes 5 or more days each week. Do not use any products that contain nicotine or tobacco. These products include cigarettes, chewing tobacco, and vaping devices, such as e-cigarettes. If you need help quitting, ask your health care provider. Do not use drugs. If you are sexually active, practice safe sex. Use a condom or other form of protection to   prevent STIs. If you do not wish to become pregnant, use a form of birth control. If you plan to become pregnant, see your health care provider for a  prepregnancy visit. Take aspirin only as told by your health care provider. Make sure that you understand how much to take and what form to take. Work with your health care provider to find out whether it is safe and beneficial for you to take aspirin daily. Find healthy ways to manage stress, such as: Meditation, yoga, or listening to music. Journaling. Talking to a trusted person. Spending time with friends and family. Minimize exposure to UV radiation to reduce your risk of skin cancer. Safety Always wear your seat belt while driving or riding in a vehicle. Do not drive: If you have been drinking alcohol. Do not ride with someone who has been drinking. When you are tired or distracted. While texting. If you have been using any mind-altering substances or drugs. Wear a helmet and other protective equipment during sports activities. If you have firearms in your house, make sure you follow all gun safety procedures. Seek help if you have been physically or sexually abused. What's next? Visit your health care provider once a year for an annual wellness visit. Ask your health care provider how often you should have your eyes and teeth checked. Stay up to date on all vaccines. This information is not intended to replace advice given to you by your health care provider. Make sure you discuss any questions you have with your health care provider. Document Revised: 08/13/2020 Document Reviewed: 08/13/2020 Elsevier Patient Education  2024 [employer]  

## 2023-02-09 ENCOUNTER — Other Ambulatory Visit: Payer: Self-pay | Admitting: Family Medicine

## 2023-02-09 DIAGNOSIS — E049 Nontoxic goiter, unspecified: Secondary | ICD-10-CM

## 2023-02-09 DIAGNOSIS — R5381 Other malaise: Secondary | ICD-10-CM

## 2023-02-09 DIAGNOSIS — R6889 Other general symptoms and signs: Secondary | ICD-10-CM

## 2023-02-09 DIAGNOSIS — R635 Abnormal weight gain: Secondary | ICD-10-CM

## 2023-02-14 ENCOUNTER — Ambulatory Visit
Admission: RE | Admit: 2023-02-14 | Discharge: 2023-02-14 | Disposition: A | Discharge status: Home / Self Care | Payer: No Typology Code available for payment source | Source: Ambulatory Visit | Attending: Family Medicine | Admitting: Family Medicine

## 2023-02-14 DIAGNOSIS — R5381 Other malaise: Secondary | ICD-10-CM | POA: Insufficient documentation

## 2023-02-14 DIAGNOSIS — R6889 Other general symptoms and signs: Secondary | ICD-10-CM | POA: Insufficient documentation

## 2023-02-14 DIAGNOSIS — R635 Abnormal weight gain: Secondary | ICD-10-CM | POA: Diagnosis present

## 2023-02-14 DIAGNOSIS — R5383 Other fatigue: Secondary | ICD-10-CM | POA: Diagnosis present

## 2023-02-14 DIAGNOSIS — E049 Nontoxic goiter, unspecified: Secondary | ICD-10-CM | POA: Insufficient documentation

## 2023-05-11 ENCOUNTER — Other Ambulatory Visit: Payer: Self-pay | Admitting: Certified Nurse Midwife

## 2023-05-11 DIAGNOSIS — N6489 Other specified disorders of breast: Secondary | ICD-10-CM

## 2023-06-15 ENCOUNTER — Ambulatory Visit
Admission: RE | Admit: 2023-06-15 | Discharge: 2023-06-15 | Disposition: A | Discharge status: Home / Self Care | Source: Ambulatory Visit | Attending: Certified Nurse Midwife | Admitting: Certified Nurse Midwife

## 2023-06-15 DIAGNOSIS — N6489 Other specified disorders of breast: Secondary | ICD-10-CM | POA: Insufficient documentation

## 2023-10-05 ENCOUNTER — Other Ambulatory Visit: Payer: Self-pay | Admitting: Physician Assistant

## 2024-03-21 ENCOUNTER — Ambulatory Visit (INDEPENDENT_AMBULATORY_CARE_PROVIDER_SITE_OTHER): Payer: Self-pay | Admitting: Certified Nurse Midwife

## 2024-03-21 ENCOUNTER — Other Ambulatory Visit (HOSPITAL_COMMUNITY)
Admission: RE | Admit: 2024-03-21 | Discharge: 2024-03-21 | Disposition: A | Source: Ambulatory Visit | Attending: Certified Nurse Midwife | Admitting: Certified Nurse Midwife

## 2024-03-21 ENCOUNTER — Encounter: Payer: Self-pay | Admitting: Certified Nurse Midwife

## 2024-03-21 VITALS — BP 123/45 | HR 73 | Ht 65.0 in | Wt 145.8 lb

## 2024-03-21 DIAGNOSIS — N941 Unspecified dyspareunia: Secondary | ICD-10-CM | POA: Insufficient documentation

## 2024-03-21 DIAGNOSIS — R6882 Decreased libido: Secondary | ICD-10-CM

## 2024-03-21 DIAGNOSIS — Z1231 Encounter for screening mammogram for malignant neoplasm of breast: Secondary | ICD-10-CM

## 2024-03-21 DIAGNOSIS — Z780 Asymptomatic menopausal state: Secondary | ICD-10-CM

## 2024-03-21 DIAGNOSIS — Z124 Encounter for screening for malignant neoplasm of cervix: Secondary | ICD-10-CM | POA: Insufficient documentation

## 2024-03-21 DIAGNOSIS — Z01419 Encounter for gynecological examination (general) (routine) without abnormal findings: Secondary | ICD-10-CM

## 2024-03-21 MED ORDER — ADDYI 100 MG PO TABS: 100.0000 mg | ORAL_TABLET | Freq: Every day | ORAL | 3 refills | Status: AC

## 2024-03-21 MED ORDER — ESTRADIOL 0.01 % VA CREA: 1.0000 | TOPICAL_CREAM | Freq: Every day | VAGINAL | 12 refills | Status: AC

## 2024-03-21 MED ORDER — ACYCLOVIR 400 MG PO TABS: 400.0000 mg | ORAL_TABLET | Freq: Two times a day (BID) | ORAL | 3 refills | Status: AC

## 2024-03-21 NOTE — Progress Notes (Addendum)
 "        GYNECOLOGY ANNUAL PREVENTATIVE CARE ENCOUNTER NOTE  History:     Daisy Soto is a 54 y.o. G60P1001 female here for a routine annual gynecologic exam with follow up pap for ASCUS in 2018. Patient has had a hysterectomy. She complains of vaginal dryness, dyspareunia, and low libido, hypoactive sexual desire. We discussed signs and symptoms of menopause and hormone therapy. Patient interested in trying vaginal estrogen to help with GUSM. Her last colonoscopy was 10/27/22 and last mammogram was 06/15/23, Bi Rads 2: benign. She requested a refill of acyclovir  which she reports taking as needed.   Social Relationship: married Living:husband Work:Auditor full time Exercise: walking 4x a week Smoke/Alcohol/drug use:No/ Rarely/ No  Gynecologic History No LMP recorded. Patient has had a hysterectomy. Contraception: hysterectomy Last Pap: 01/02/2020. Results were: normal with negative HPV Last mammogram: 06/15/23. Results were: normal  Obstetric History OB History  Gravida Para Term Preterm AB Living  1 1 1   1   SAB IAB Ectopic Multiple Live Births      1    # Outcome Date GA Lbr Len/2nd Weight Sex Type Anes PTL Lv  1 Term 06/20/98   7 lb (3.175 kg) F CS-Unspec   LIV    Past Medical History:  Diagnosis Date   COVID-19 02/22/2019   Depression    Desensitization to allergy shot    GERD (gastroesophageal reflux disease)    Herpes genitalia    PONV (postoperative nausea and vomiting)     Past Surgical History:  Procedure Laterality Date   ABDOMINAL HYSTERECTOMY  2007   CESAREAN SECTION  4- 2000   ETHMOIDECTOMY Right 10/21/2016   Procedure: RIGHT TOTAL ETHMOIDECTOMY/ REMOVE ASTEOMA WITHIN RIGHT ETHMOID;  Surgeon: Edda Mt, MD;  Location: [facility];  Service: ENT;  Laterality: Right;   IMAGE GUIDED SINUS SURGERY N/A 10/21/2016   Procedure: IMAGE GUIDED SINUS SURGERY;  Surgeon: Edda Mt, MD;  Location: [facility];  Service: ENT;  Laterality: N/A;   disc given to Tabitha 09/23/16   MAXILLARY ANTROSTOMY Bilateral 10/21/2016   Procedure: MAXILLARY ANTROSTOMY;  Surgeon: Edda Mt, MD;  Location: [facility];  Service: ENT;  Laterality: Bilateral;    Medications Ordered Prior to Encounter[1]  Allergies[2]  Social History:  reports that she has never smoked. She has never used smokeless tobacco. She reports current alcohol use. She reports that she does not use drugs.  Family History  Problem Relation Age of Onset   Stroke Mother    Hypertension Mother    Heart disease Father    Hypertension Father    Ovarian cancer Maternal Grandmother 2   Breast cancer Neg Hx    Colon cancer Neg Hx    Diabetes Neg Hx    Stomach cancer Neg Hx    Pancreatic cancer Neg Hx    Esophageal cancer Neg Hx    Rectal cancer Neg Hx     The following portions of the patient's history were reviewed and updated as appropriate: allergies, current medications, past family history, past medical history, past social history, past surgical history and problem list.  Review of Systems Pertinent items noted in HPI and remainder of comprehensive ROS otherwise negative.  Physical Exam:  BP (!) 123/45 (BP Location: Left Arm, Patient Position: Sitting;Bed low/side rails up, Cuff Size: Small)   Pulse 73   Ht 5' 5 (1.651 m)   Wt 145 lb 12.8 oz (66.1 kg)   BMI 24.26 kg/m  CONSTITUTIONAL:  Well-developed, well-nourished female in no acute distress.  HENT:  Normocephalic, atraumatic, External right and left ear normal. Oropharynx is clear and moist EYES: Conjunctivae and EOM are normal. Pupils are equal, round, and reactive to light. No scleral icterus.  NECK: Normal range of motion, supple, no masses.  Normal thyroid .  SKIN: Skin is warm and dry. No rash noted. Not diaphoretic. No erythema. No pallor. MUSCULOSKELETAL: Normal range of motion. No tenderness.  No cyanosis, clubbing, or edema.  2+ distal pulses. NEUROLOGIC: Alert and oriented to person,  place, and time. Normal reflexes, muscle tone coordination.  PSYCHIATRIC: Normal mood and affect. Normal behavior. Normal judgment and thought content. CARDIOVASCULAR: Normal heart rate noted, regular rhythm RESPIRATORY: Clear to auscultation bilaterally. Effort and breath sounds normal, no problems with respiration noted. BREASTS: Symmetric in size. No masses, tenderness, skin changes, nipple drainage, or lymphadenopathy bilaterally.  ABDOMEN: Soft, no distention noted.  No tenderness, rebound or guarding.  PELVIC: Normal appearing external genitalia and urethral meatus; normal appearing vaginal mucosa.  No abnormal discharge noted.  Pap smear of vaginal cuff obtained. Hysterectomy, right oophorectomy. Left adnexa nontender, not enlarged.   Assessment and Plan:   Will follow up results of pap smear and manage accordingly. If pap is normal, no further screening is required. Colonoscopy up to date, not due til 2034. Mammogram ordered today. Hypoactive sexual desire disorder.   Pap: completed today for follow up on abnormal pap in 2018, ASCUS with negative HPV Mammogram : 06/15/23 Bi Rads 2: benign, ordered today Labs: FSH done today to assess for menopause Refills: Acyclovir  Orders: Addyi , Vaginal estrogen Referral: N/A Routine preventative health maintenance measures emphasized. Please refer to After Visit Summary for other counseling recommendations.   Geronimo Pouch, PA-S     [1]  Current Outpatient Medications on File Prior to Visit  Medication Sig Dispense Refill   acyclovir  (ZOVIRAX ) 400 MG tablet Take 1 tablet (400 mg total) by mouth 2 (two) times daily. 180 tablet 3   cetirizine (ZYRTEC) 10 MG tablet Take 10 mg by mouth daily as needed.     esomeprazole  (NEXIUM ) 40 MG capsule TAKE 1 CAPSULE BY MOUTH ONCE DAILY BEFORE BREAKFAST 90 capsule 3   FLUoxetine (PROZAC) 40 MG capsule Take 40 mg by mouth daily.     fluticasone (FLONASE) 50 MCG/ACT nasal spray      levothyroxine  (SYNTHROID) 25 MCG tablet Take 25 mcg by mouth.     Liothyronine Sodium (CYTOMEL PO) Take by mouth.     rOPINIRole (REQUIP) 0.25 MG tablet Take by mouth.     traZODone (DESYREL) 50 MG tablet 50 mg. Takes one to two qhs prn  0   No current facility-administered medications on file prior to visit.  [2]  Allergies Allergen Reactions   Clarithromycin Other (See Comments) and Nausea And Vomiting    Makes stomach burn   "

## 2024-03-21 NOTE — Addendum Note (Signed)
 Addended by: DELANA SAUER on: 03/21/2024 04:20 PM   Modules accepted: Orders

## 2024-03-21 NOTE — Addendum Note (Signed)
 Addended by: SEBASTIAN ZELDA HERO on: 03/21/2024 04:30 PM   Modules accepted: Orders

## 2024-03-22 LAB — FOLLICLE STIMULATING HORMONE: FSH: 86.5 m[IU]/mL

## 2024-03-23 ENCOUNTER — Encounter: Payer: Self-pay | Admitting: Certified Nurse Midwife

## 2024-03-27 LAB — CYTOLOGY - PAP
Comment: NEGATIVE
Diagnosis: NEGATIVE
High risk HPV: NEGATIVE
